# Patient Record
Sex: Female | Born: 1935 | Race: White | Hispanic: No | Marital: Married | Smoking: Never smoker
Health system: Southern US, Community
[De-identification: ages and names within clinical notes are randomized; demographics above are authoritative.]

## PROBLEM LIST (undated history)

## (undated) DIAGNOSIS — I2510 Atherosclerotic heart disease of native coronary artery without angina pectoris: Secondary | ICD-10-CM

## (undated) DIAGNOSIS — I639 Cerebral infarction, unspecified: Secondary | ICD-10-CM

## (undated) DIAGNOSIS — F0390 Unspecified dementia without behavioral disturbance: Secondary | ICD-10-CM

## (undated) DIAGNOSIS — I272 Pulmonary hypertension, unspecified: Secondary | ICD-10-CM

## (undated) DIAGNOSIS — M1990 Unspecified osteoarthritis, unspecified site: Secondary | ICD-10-CM

## (undated) DIAGNOSIS — J449 Chronic obstructive pulmonary disease, unspecified: Secondary | ICD-10-CM

## (undated) DIAGNOSIS — C50919 Malignant neoplasm of unspecified site of unspecified female breast: Secondary | ICD-10-CM

## (undated) DIAGNOSIS — I4891 Unspecified atrial fibrillation: Secondary | ICD-10-CM

## (undated) DIAGNOSIS — S065XAA Traumatic subdural hemorrhage with loss of consciousness status unknown, initial encounter: Secondary | ICD-10-CM

## (undated) DIAGNOSIS — S065X9A Traumatic subdural hemorrhage with loss of consciousness of unspecified duration, initial encounter: Secondary | ICD-10-CM

## (undated) HISTORY — PX: BRAIN SURGERY: SHX531

## (undated) HISTORY — PX: TOE AMPUTATION: SHX809

## (undated) HISTORY — PX: CARDIAC SURGERY: SHX584

---

## 1999-02-15 ENCOUNTER — Encounter: Payer: Self-pay | Admitting: Neurosurgery

## 1999-02-15 ENCOUNTER — Inpatient Hospital Stay (HOSPITAL_COMMUNITY): Admission: EM | Admit: 1999-02-15 | Discharge: 1999-02-17 | Payer: Self-pay | Admitting: Neurosurgery

## 1999-02-17 ENCOUNTER — Encounter: Payer: Self-pay | Admitting: Neurosurgery

## 1999-02-23 ENCOUNTER — Inpatient Hospital Stay (HOSPITAL_COMMUNITY): Admission: AD | Admit: 1999-02-23 | Discharge: 1999-03-13 | Payer: Self-pay | Admitting: Neurosurgery

## 1999-02-24 ENCOUNTER — Encounter: Payer: Self-pay | Admitting: Neurosurgery

## 1999-02-25 ENCOUNTER — Encounter: Payer: Self-pay | Admitting: Neurosurgery

## 1999-03-02 ENCOUNTER — Encounter: Payer: Self-pay | Admitting: Pulmonary Disease

## 1999-03-04 ENCOUNTER — Encounter: Payer: Self-pay | Admitting: Neurological Surgery

## 1999-03-06 ENCOUNTER — Encounter: Payer: Self-pay | Admitting: Neurosurgery

## 1999-03-12 ENCOUNTER — Encounter: Payer: Self-pay | Admitting: Neurosurgery

## 1999-03-13 ENCOUNTER — Inpatient Hospital Stay (HOSPITAL_COMMUNITY)
Admission: RE | Admit: 1999-03-13 | Discharge: 1999-04-04 | Payer: Self-pay | Admitting: Physical Medicine and Rehabilitation

## 1999-03-14 ENCOUNTER — Encounter: Payer: Self-pay | Admitting: Physical Medicine and Rehabilitation

## 1999-03-18 ENCOUNTER — Encounter: Payer: Self-pay | Admitting: Physical Medicine & Rehabilitation

## 1999-03-27 ENCOUNTER — Encounter: Payer: Self-pay | Admitting: Physical Medicine and Rehabilitation

## 1999-04-05 ENCOUNTER — Encounter
Admission: RE | Admit: 1999-04-05 | Discharge: 1999-04-27 | Payer: Self-pay | Admitting: Physical Medicine and Rehabilitation

## 2003-11-10 ENCOUNTER — Other Ambulatory Visit: Payer: Self-pay

## 2004-01-14 ENCOUNTER — Other Ambulatory Visit: Payer: Self-pay

## 2004-05-27 ENCOUNTER — Emergency Department: Payer: Self-pay | Admitting: General Practice

## 2004-05-28 ENCOUNTER — Emergency Department: Payer: Self-pay | Admitting: Emergency Medicine

## 2004-06-01 ENCOUNTER — Ambulatory Visit: Payer: Self-pay | Admitting: Internal Medicine

## 2004-07-12 ENCOUNTER — Encounter: Admission: RE | Admit: 2004-07-12 | Discharge: 2004-07-12 | Payer: Self-pay | Admitting: Neurosurgery

## 2004-08-21 ENCOUNTER — Inpatient Hospital Stay (HOSPITAL_COMMUNITY): Admission: RE | Admit: 2004-08-21 | Discharge: 2004-08-22 | Payer: Self-pay | Admitting: Neurosurgery

## 2004-09-12 ENCOUNTER — Encounter: Admission: RE | Admit: 2004-09-12 | Discharge: 2004-09-12 | Payer: Self-pay | Admitting: Neurosurgery

## 2004-11-16 ENCOUNTER — Encounter: Payer: Self-pay | Admitting: Rheumatology

## 2004-11-24 ENCOUNTER — Encounter: Payer: Self-pay | Admitting: Rheumatology

## 2005-01-20 ENCOUNTER — Inpatient Hospital Stay: Payer: Self-pay | Admitting: Internal Medicine

## 2005-01-20 ENCOUNTER — Other Ambulatory Visit: Payer: Self-pay

## 2005-05-15 ENCOUNTER — Ambulatory Visit: Payer: Self-pay | Admitting: Internal Medicine

## 2006-06-04 ENCOUNTER — Ambulatory Visit: Payer: Self-pay | Admitting: Internal Medicine

## 2006-06-05 ENCOUNTER — Ambulatory Visit: Payer: Self-pay | Admitting: Anesthesiology

## 2006-06-11 ENCOUNTER — Ambulatory Visit: Payer: Self-pay | Admitting: Anesthesiology

## 2006-06-26 ENCOUNTER — Ambulatory Visit: Payer: Self-pay | Admitting: Anesthesiology

## 2006-07-07 ENCOUNTER — Ambulatory Visit: Payer: Self-pay | Admitting: Anesthesiology

## 2006-08-06 ENCOUNTER — Ambulatory Visit: Payer: Self-pay | Admitting: Anesthesiology

## 2006-09-09 ENCOUNTER — Ambulatory Visit: Payer: Self-pay | Admitting: Anesthesiology

## 2006-09-23 ENCOUNTER — Ambulatory Visit: Payer: Self-pay | Admitting: Cardiology

## 2006-10-09 ENCOUNTER — Ambulatory Visit: Payer: Self-pay | Admitting: Anesthesiology

## 2006-11-10 ENCOUNTER — Ambulatory Visit: Payer: Self-pay | Admitting: Anesthesiology

## 2006-12-18 ENCOUNTER — Ambulatory Visit: Payer: Self-pay | Admitting: Anesthesiology

## 2007-02-09 ENCOUNTER — Other Ambulatory Visit: Payer: Self-pay

## 2007-02-09 ENCOUNTER — Inpatient Hospital Stay: Payer: Self-pay | Admitting: Internal Medicine

## 2007-02-10 ENCOUNTER — Other Ambulatory Visit: Payer: Self-pay

## 2007-04-10 ENCOUNTER — Ambulatory Visit: Payer: Self-pay | Admitting: Anesthesiology

## 2007-04-30 ENCOUNTER — Ambulatory Visit: Payer: Self-pay | Admitting: Anesthesiology

## 2007-05-20 ENCOUNTER — Ambulatory Visit: Payer: Self-pay | Admitting: Unknown Physician Specialty

## 2007-05-22 ENCOUNTER — Ambulatory Visit: Payer: Self-pay | Admitting: Unknown Physician Specialty

## 2007-06-11 ENCOUNTER — Ambulatory Visit: Payer: Self-pay | Admitting: Anesthesiology

## 2007-07-20 ENCOUNTER — Ambulatory Visit: Payer: Self-pay | Admitting: Anesthesiology

## 2007-08-11 ENCOUNTER — Ambulatory Visit: Payer: Self-pay | Admitting: Internal Medicine

## 2007-09-10 ENCOUNTER — Ambulatory Visit: Payer: Self-pay | Admitting: Internal Medicine

## 2007-10-15 ENCOUNTER — Ambulatory Visit: Payer: Self-pay | Admitting: Anesthesiology

## 2008-01-14 ENCOUNTER — Ambulatory Visit: Payer: Self-pay | Admitting: Anesthesiology

## 2008-04-13 ENCOUNTER — Ambulatory Visit: Payer: Self-pay | Admitting: Anesthesiology

## 2008-06-08 ENCOUNTER — Ambulatory Visit: Payer: Self-pay | Admitting: Unknown Physician Specialty

## 2008-07-28 ENCOUNTER — Ambulatory Visit: Payer: Self-pay | Admitting: Unknown Physician Specialty

## 2008-08-01 ENCOUNTER — Ambulatory Visit: Payer: Self-pay | Admitting: Unknown Physician Specialty

## 2008-09-04 ENCOUNTER — Observation Stay: Payer: Self-pay | Admitting: Internal Medicine

## 2008-09-14 ENCOUNTER — Inpatient Hospital Stay: Payer: Self-pay | Admitting: Internal Medicine

## 2008-10-19 ENCOUNTER — Ambulatory Visit: Payer: Self-pay | Admitting: Internal Medicine

## 2009-01-18 ENCOUNTER — Encounter: Admission: RE | Admit: 2009-01-18 | Discharge: 2009-01-18 | Payer: Self-pay | Admitting: Neurosurgery

## 2009-07-16 ENCOUNTER — Emergency Department: Payer: Self-pay | Admitting: Emergency Medicine

## 2009-10-23 ENCOUNTER — Ambulatory Visit: Payer: Self-pay | Admitting: Internal Medicine

## 2010-04-26 IMAGING — CT CT ABD-PELV W/O CM
1 of 2 series · 15 of 32 positions shown, 19 images · non-contrast
Comparison: None

REASON FOR EXAM: (1) LLQ pain, vomiting concern for diverticulitis/stone
allergic to iodine; (2)
COMMENTS:

PROCEDURE:     CT  - CT ABDOMEN AND PELVIS W[DATE] [DATE]
RESULT:     Indication: Left lower quadrant pain
TECHNIQUE: Multiple axial images from the lung bases to the symphysis pubis
were obtained without oral and without intravenous contrast.

[Series 2: stone · axial · 0.67mm/px · z∈[-896,-518]mm · 15 of 138 slices shown, 19 images]
[im 6/138  soft-tissue]
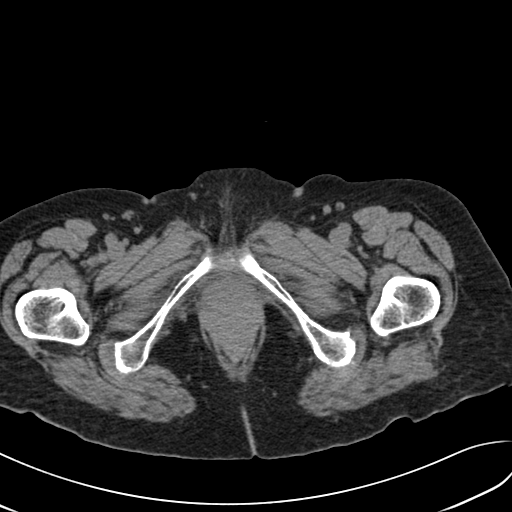
[im 6/138  bone]
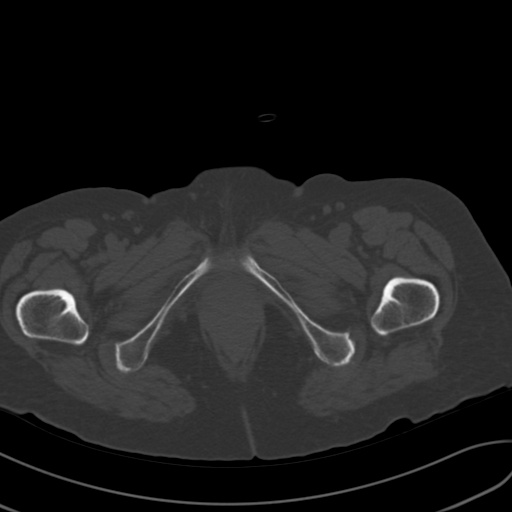
[im 16/138  soft-tissue]
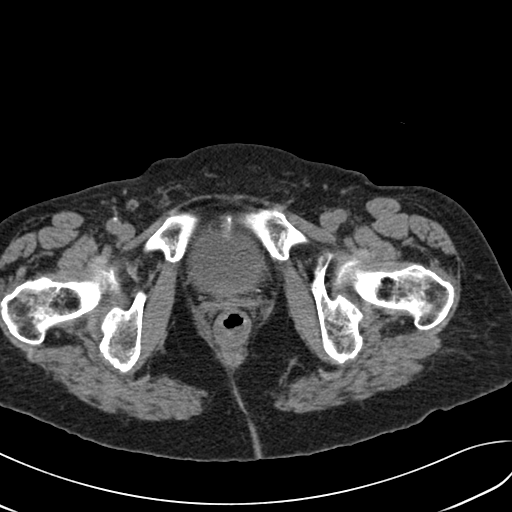
[im 27/138  soft-tissue]
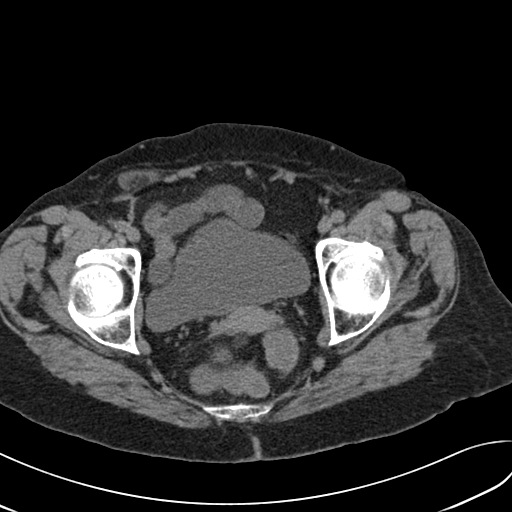
[im 37/138  soft-tissue]
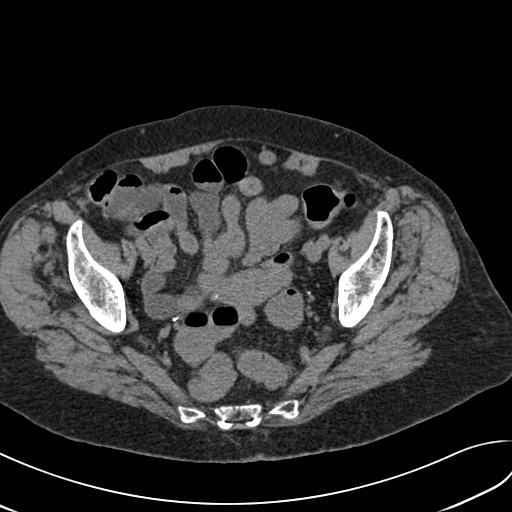
[im 48/138  soft-tissue]
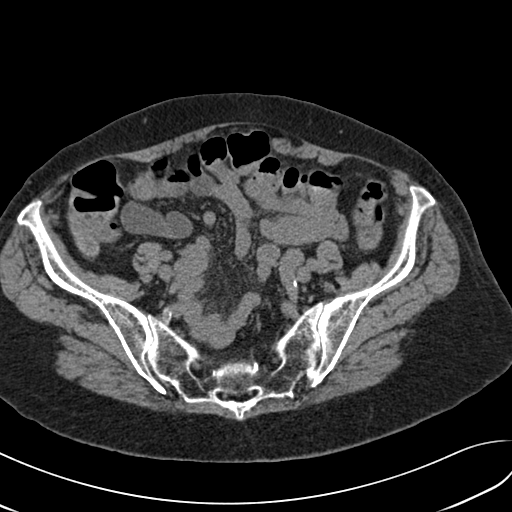
[im 58/138  soft-tissue]
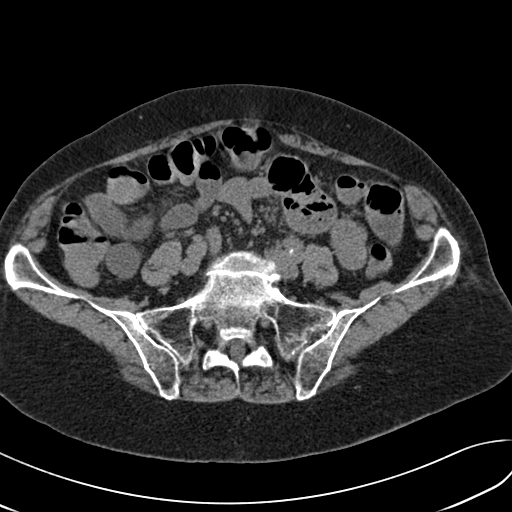
[im 69/138  soft-tissue]
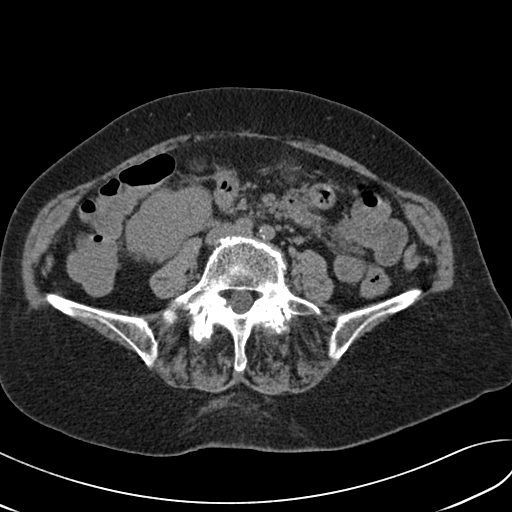
[im 80/138  soft-tissue]
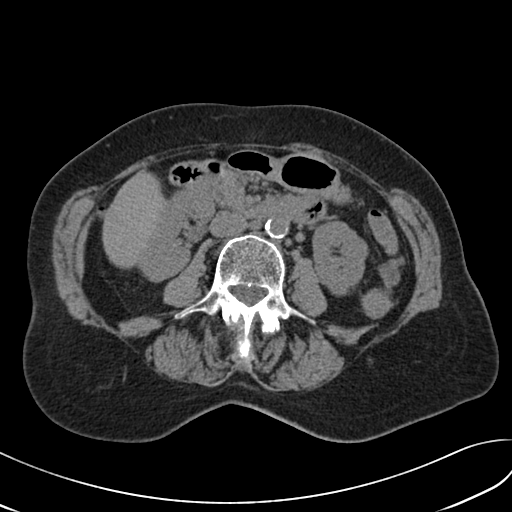
[im 90/138  soft-tissue]
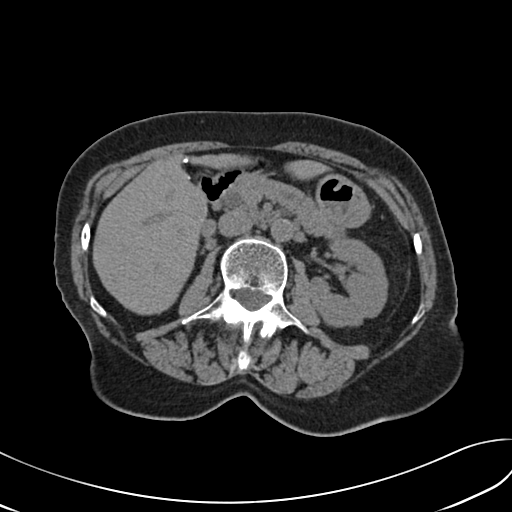
[im 90/138  bone]
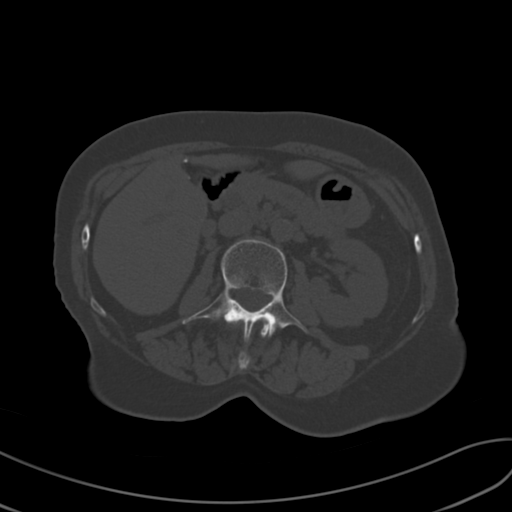
[im 101/138  soft-tissue]
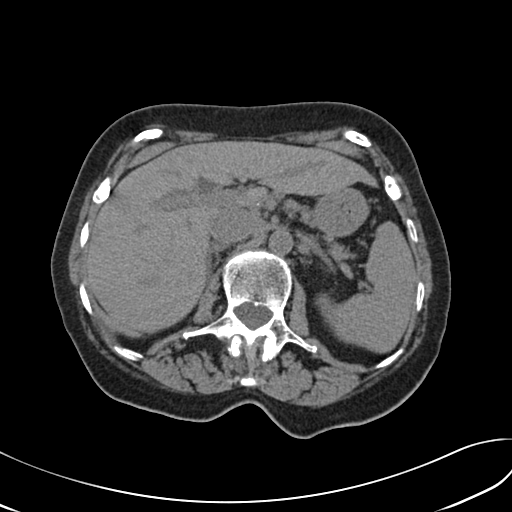
[im 111/138  soft-tissue]
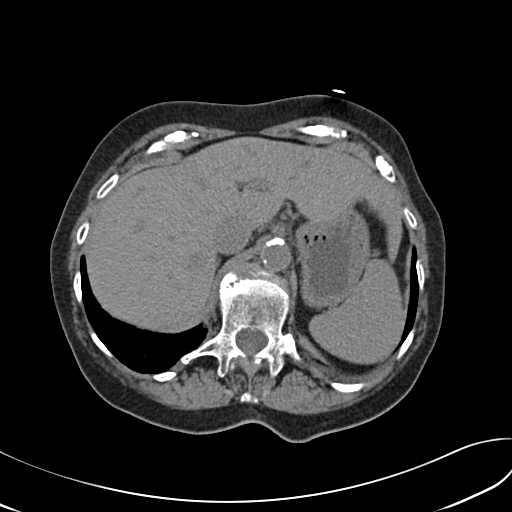
[im 116/138  lung]
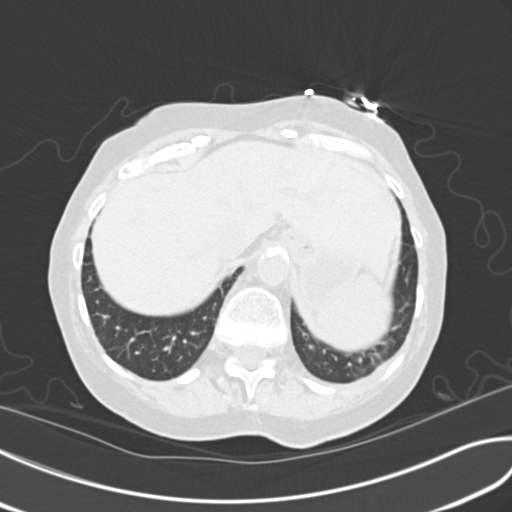
[im 122/138  soft-tissue]
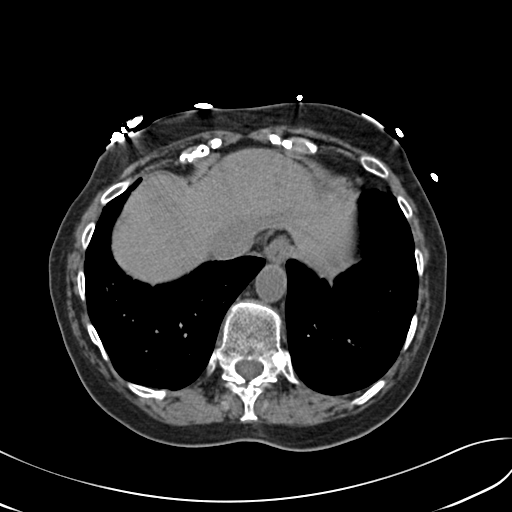
[im 122/138  lung]
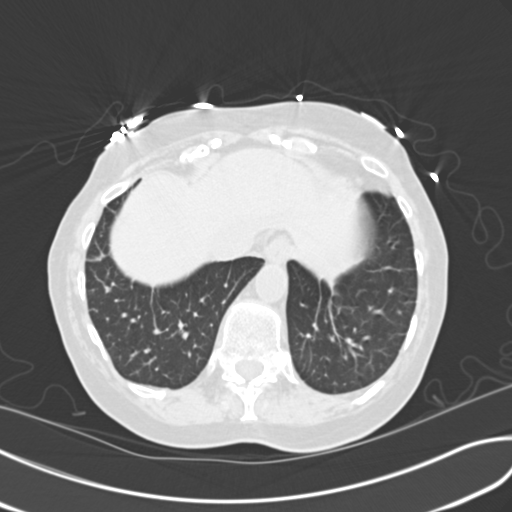
[im 127/138  lung]
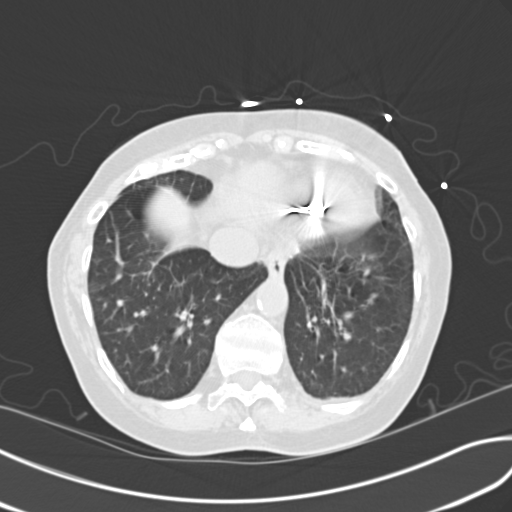
[im 132/138  soft-tissue]
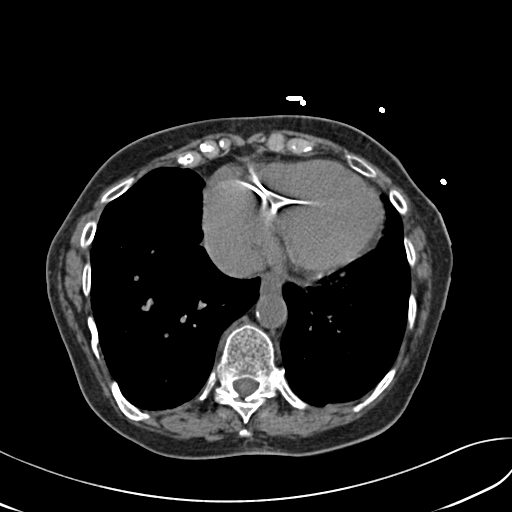
[im 132/138  lung]
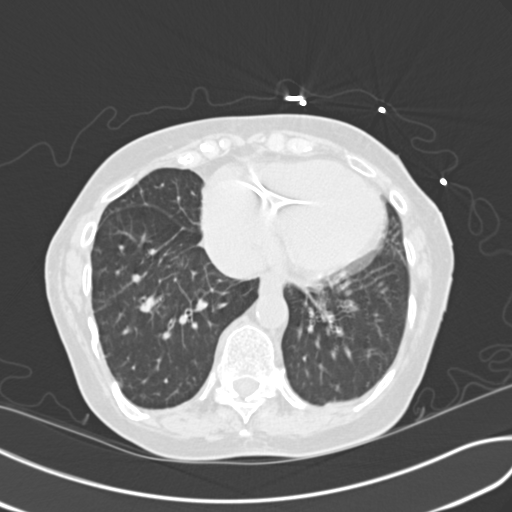

[15 of 32 positions shown; findings below may reference images not displayed]

FINDINGS: The lung bases are clear. There is no pleural or pericardial effusions.

No renal, ureteral, or bladder calculi. No obstructive uropathy. No
perinephric stranding is seen. The kidneys are symmetric in size without
evidence for exophytic mass. The bladder is unremarkable.

The liver demonstrates no focal abnormality. The gallbladder is surgically
absent. The common bile duct is dilated. There is a coarse calcification
within the spleen. The spleen demonstrates no other focal abnormality. The
adrenal glands and pancreas are normal.

There is a fat containing umbilical hernia. The unopacified stomach,
duodenum, small intestine, and large intestine are unremarkable, but
evaluation is limited by lack of oral contrast. There is a normal caliber
appendix in the right lower quadrant without periappendiceal inflammatory
changes. There is no pneumoperitoneum, pneumatosis, or portal venous gas.
There is no abdominal or pelvic free fluid. There is no lymphadenopathy.

The abdominal aorta is normal in caliber with atherosclerosis.

There is severe L5-S1 facet arthropathy. There is lumbar spine spondylosis.
IMPRESSION: 1. No acute abdominal or pelvic pathology.
2. Normal appendix.
3. The common bile duct is dilated which may be secondary to the
postcholecystectomy state versus a distal obstruction. Correlate with liver
function tests.

## 2010-07-04 ENCOUNTER — Ambulatory Visit: Payer: Self-pay | Admitting: Internal Medicine

## 2010-07-13 ENCOUNTER — Ambulatory Visit: Payer: Self-pay | Admitting: Internal Medicine

## 2010-07-16 ENCOUNTER — Encounter: Payer: Self-pay | Admitting: Internal Medicine

## 2010-07-26 ENCOUNTER — Encounter: Payer: Self-pay | Admitting: Internal Medicine

## 2010-10-12 NOTE — Op Note (Signed)
NAMEJENNIFERANN, Brittany Casey                  ACCOUNT NO.:  [id number]   MEDICAL RECORD NO.:  [id number]          PATIENT TYPE:  INP   LOCATION:  2899                         FACILITY:  [facility]   PHYSICIAN:  Reinaldo Meeker, M.D. DATE OF BIRTH:  1927/04/26   DATE OF PROCEDURE:  08/21/2004  DATE OF DISCHARGE:                                 OPERATIVE REPORT   PREOPERATIVE DIAGNOSIS:  Spondylosis of C4-C5 and C5-C6 left.   POSTOPERATIVE DIAGNOSIS:  Spondylosis of C4-C5 and C5-C6 left.   PROCEDURE:  C4-C5 and C5-C6 anterior cervical discectomy with bone bank  fusion followed by Atlantis anterior cervical plating.   SURGEON:  Reinaldo Meeker, M.D.   ASSISTANT:  Brittany Casey. Pool, M.D.   PROCEDURE IN DETAIL:  After being placed in the supine position in 5 pounds  of halter traction, the patient's neck was prepped and draped in the usual  sterile fashion.  Localizing x-ray was taken prior to the incision to  identify the appropriate level.  A transverse incision was made in the right  anterior neck starting at the midline and heading towards the medial aspect  of the sternocleidomastoid muscle.  The platysmal muscle was incised  transversely.  Then, the fascial plane between the strap muscles medially  and the sternocleidomastoid muscle laterally was identified down to the  anterior aspect of the cervical spine.  The longus colli muscles were  identified, split in the midline, swept away bilaterally with a Automotive engineer.  A second x-ray showed we approached the  appropriate level.  Using the 15 blade, the annulus of the disc at C4-C5 and  C5-C6 was incised.  Using pituitaries, rongeurs, and curets, thorough disc  space clean out was carried out at both levels.  A high speed drill was used  to widen the interspace.  The microscope was draped and brought into the  field and used until the end of the case.  Starting at C4-C5, the very  limited disc space was followed down to the  anterior aspect of the posterior  longitudinal ligament.  The ligament and bony overgrowth were removed to  expose the underlying spinal dura.  Proximal foraminal decompression was  carried out bilaterally and more aggressive foraminal decompression was  carried out on the patient's left side until the C5 nerve root was well  decompressed.  At this point, inspection was carried out at this level for  any evidence of residual compression and none could be identified.  A  similar decompression was carried out at C5-C6, once again using the high  speed drill to widen the interspace, remove the remainder of the disc  material and bony overgrowth, as well as the posterior longitudinal  ligament. Once again, generous foraminal decompression was carried out  towards the left common symptomatic side and less aggressive decompression  towards the right asymptomatic side.  At this point, inspection was carried  out in all directions for any evidence of residual compression.  Large  amounts of irrigation were carried out.  Measurements were taken and a 9 mm  and 7 mm bone plug was reconstituted.  After irrigating once more and  confirming hemostasis, the 7 mm plug was impacted in C5-C6 and the 9 mm plug  in C4-C5.  X-ray showed the plugs to be in good position.  An appropriate  length Vision anterior cervical plate was then chosen.  Under fluoroscopic  guidance, drill holes were placed, followed by tapping and placement of 13  mm screws x 6.  Final fluoroscopy showed the plate, plug, and screws to all  be in good position.  The locking mechanism was then secured.  Large amounts  of irrigation were carried out at  this time and any bleeding controlled with bipolar coagulation.  The wound  was then closed using interrupted Vicryl in the platysma muscle, inverted 5-  0 PDS on the subcuticular layer, and Steri-Strips on the skin.  Sterile  dressings were then applied and the patient was extubated and  taken to the  recovery room in stable condition.      ROK/MEDQ  D:  08/21/2004  T:  08/21/2004  Job:  [id number]

## 2010-10-25 ENCOUNTER — Ambulatory Visit: Payer: Self-pay | Admitting: Internal Medicine

## 2010-12-21 ENCOUNTER — Inpatient Hospital Stay: Payer: Self-pay | Admitting: Internal Medicine

## 2011-08-20 ENCOUNTER — Ambulatory Visit: Payer: Self-pay | Admitting: Cardiology

## 2011-10-01 ENCOUNTER — Emergency Department: Payer: Self-pay | Admitting: *Deleted

## 2011-10-01 LAB — COMPREHENSIVE METABOLIC PANEL
Anion Gap: 11 (ref 7–16)
BUN: 43 mg/dL — ABNORMAL HIGH (ref 7–18)
Bilirubin,Total: 0.5 mg/dL (ref 0.2–1.0)
Chloride: 101 mmol/L (ref 98–107)
EGFR (African American): 35 — ABNORMAL LOW
Osmolality: 296 (ref 275–301)
Potassium: 3.2 mmol/L — ABNORMAL LOW (ref 3.5–5.1)

## 2011-10-01 LAB — CBC
HCT: 31.8 % — ABNORMAL LOW (ref 35.0–47.0)
HGB: 10.8 g/dL — ABNORMAL LOW (ref 12.0–16.0)
MCHC: 33.8 g/dL (ref 32.0–36.0)
MCV: 93 fL (ref 80–100)
RBC: 3.41 10*6/uL — ABNORMAL LOW (ref 3.80–5.20)
RDW: 14.8 % — ABNORMAL HIGH (ref 11.5–14.5)
WBC: 8.8 10*3/uL (ref 3.6–11.0)

## 2011-10-01 LAB — URINALYSIS, COMPLETE
Bilirubin,UR: NEGATIVE
Glucose,UR: NEGATIVE mg/dL (ref 0–75)
Ketone: NEGATIVE
Ph: 5 (ref 4.5–8.0)
RBC,UR: 1 /HPF (ref 0–5)
Specific Gravity: 1.015 (ref 1.003–1.030)

## 2011-10-01 LAB — LIPASE, BLOOD: Lipase: 307 U/L (ref 73–393)

## 2011-10-02 ENCOUNTER — Inpatient Hospital Stay: Payer: Self-pay | Admitting: Family Medicine

## 2011-10-02 LAB — CBC
HCT: 28.4 % — ABNORMAL LOW (ref 35.0–47.0)
HGB: 9.6 g/dL — ABNORMAL LOW (ref 12.0–16.0)
MCH: 31.6 pg (ref 26.0–34.0)
MCHC: 33.9 g/dL (ref 32.0–36.0)
MCV: 93 fL (ref 80–100)
Platelet: 144 10*3/uL — ABNORMAL LOW (ref 150–440)
RBC: 3.04 10*6/uL — ABNORMAL LOW (ref 3.80–5.20)
RDW: 14.7 % — ABNORMAL HIGH (ref 11.5–14.5)
WBC: 9 10*3/uL (ref 3.6–11.0)

## 2011-10-02 LAB — COMPREHENSIVE METABOLIC PANEL
Albumin: 3.3 g/dL — ABNORMAL LOW (ref 3.4–5.0)
Alkaline Phosphatase: 91 U/L (ref 50–136)
BUN: 40 mg/dL — ABNORMAL HIGH (ref 7–18)
Bilirubin,Total: 0.7 mg/dL (ref 0.2–1.0)
Calcium, Total: 8.7 mg/dL (ref 8.5–10.1)
Chloride: 101 mmol/L (ref 98–107)
Co2: 30 mmol/L (ref 21–32)
EGFR (African American): 34 — ABNORMAL LOW
EGFR (Non-African Amer.): 29 — ABNORMAL LOW
SGOT(AST): 92 U/L — ABNORMAL HIGH (ref 15–37)
Sodium: 138 mmol/L (ref 136–145)
Total Protein: 6.8 g/dL (ref 6.4–8.2)

## 2011-10-02 LAB — URINALYSIS, COMPLETE
Nitrite: NEGATIVE
Ph: 5 (ref 4.5–8.0)
Protein: NEGATIVE
RBC,UR: <1 /HPF (ref 0–5)
Specific Gravity: 1.013 (ref 1.003–1.030)
Squamous Epithelial: 1
WBC UR: <1 /HPF (ref 0–5)

## 2011-10-02 LAB — PRO B NATRIURETIC PEPTIDE: B-Type Natriuretic Peptide: 6478 pg/mL — ABNORMAL HIGH (ref 0–450)

## 2011-10-02 LAB — LIPASE, BLOOD: Lipase: 142 U/L (ref 73–393)

## 2011-10-02 LAB — IRON AND TIBC
Iron Bind.Cap.(Total): 352 ug/dL (ref 250–450)
Iron Saturation: 21 %
Unbound Iron-Bind.Cap.: 279 ug/dL

## 2011-10-02 LAB — TROPONIN I: Troponin-I: 0.03 ng/mL

## 2011-10-02 LAB — CK TOTAL AND CKMB (NOT AT ARMC): CK-MB: 2.1 ng/mL (ref 0.5–3.6)

## 2011-10-03 LAB — BASIC METABOLIC PANEL
Anion Gap: 9 (ref 7–16)
Calcium, Total: 8.9 mg/dL (ref 8.5–10.1)
EGFR (African American): 39 — ABNORMAL LOW
EGFR (Non-African Amer.): 34 — ABNORMAL LOW
Osmolality: 289 (ref 275–301)
Potassium: 2.8 mmol/L — ABNORMAL LOW (ref 3.5–5.1)
Sodium: 140 mmol/L (ref 136–145)

## 2011-10-03 LAB — CBC WITH DIFFERENTIAL/PLATELET
Basophil #: 0 10*3/uL (ref 0.0–0.1)
Eosinophil #: 0 10*3/uL (ref 0.0–0.7)
HGB: 10.7 g/dL — ABNORMAL LOW (ref 12.0–16.0)
Lymphocyte #: 0.9 10*3/uL — ABNORMAL LOW (ref 1.0–3.6)
Lymphocyte %: 12 %
MCHC: 34 g/dL (ref 32.0–36.0)
MCV: 93 fL (ref 80–100)
Monocyte #: 0.8 x10 3/mm (ref 0.2–0.9)
Neutrophil #: 6.1 10*3/uL (ref 1.4–6.5)
Neutrophil %: 77 %
RDW: 14.7 % — ABNORMAL HIGH (ref 11.5–14.5)

## 2011-10-03 LAB — LIPID PANEL
Cholesterol: 146 mg/dL (ref 0–200)
HDL Cholesterol: 51 mg/dL (ref 40–60)
Ldl Cholesterol, Calc: 77 mg/dL (ref 0–100)
Triglycerides: 88 mg/dL (ref 0–200)

## 2011-10-03 LAB — CK TOTAL AND CKMB (NOT AT ARMC): CK-MB: 2.9 ng/mL (ref 0.5–3.6)

## 2011-10-03 LAB — TROPONIN I: Troponin-I: 0.04 ng/mL

## 2011-10-03 LAB — HEMOGLOBIN A1C: Hemoglobin A1C: 5.5 % (ref 4.2–6.3)

## 2011-10-04 LAB — BASIC METABOLIC PANEL
Anion Gap: 8 (ref 7–16)
BUN: 39 mg/dL — ABNORMAL HIGH (ref 7–18)
Calcium, Total: 8.6 mg/dL (ref 8.5–10.1)
Chloride: 99 mmol/L (ref 98–107)
Co2: 35 mmol/L — ABNORMAL HIGH (ref 21–32)
Creatinine: 1.37 mg/dL — ABNORMAL HIGH (ref 0.60–1.30)
EGFR (African American): 41 — ABNORMAL LOW
EGFR (Non-African Amer.): 36 — ABNORMAL LOW
Osmolality: 293 (ref 275–301)
Potassium: 2.7 mmol/L — ABNORMAL LOW (ref 3.5–5.1)

## 2011-10-04 LAB — MAGNESIUM: Magnesium: 1.6 mg/dL — ABNORMAL LOW

## 2011-10-05 LAB — BASIC METABOLIC PANEL
BUN: 35 mg/dL — ABNORMAL HIGH (ref 7–18)
Calcium, Total: 8.6 mg/dL (ref 8.5–10.1)
Chloride: 100 mmol/L (ref 98–107)
Co2: 32 mmol/L (ref 21–32)
Creatinine: 1.21 mg/dL (ref 0.60–1.30)
EGFR (African American): 48 — ABNORMAL LOW
Osmolality: 290 (ref 275–301)
Sodium: 141 mmol/L (ref 136–145)

## 2011-10-05 LAB — URINALYSIS, COMPLETE
Bacteria: NONE SEEN
Bilirubin,UR: NEGATIVE
Glucose,UR: NEGATIVE mg/dL (ref 0–75)
Ketone: NEGATIVE
Nitrite: NEGATIVE
Ph: 7 (ref 4.5–8.0)
RBC,UR: 1 /HPF (ref 0–5)
WBC UR: 2 /HPF (ref 0–5)

## 2011-10-05 LAB — MAGNESIUM: Magnesium: 1.9 mg/dL

## 2011-10-05 LAB — CBC WITH DIFFERENTIAL/PLATELET
Eosinophil #: 0 10*3/uL (ref 0.0–0.7)
HCT: 31.7 % — ABNORMAL LOW (ref 35.0–47.0)
HGB: 10.8 g/dL — ABNORMAL LOW (ref 12.0–16.0)
Lymphocyte #: 0.8 10*3/uL — ABNORMAL LOW (ref 1.0–3.6)
Lymphocyte %: 17.1 %
MCH: 31.6 pg (ref 26.0–34.0)
MCHC: 34.1 g/dL (ref 32.0–36.0)
MCV: 93 fL (ref 80–100)
Monocyte #: 0.6 x10 3/mm (ref 0.2–0.9)
Monocyte %: 12.4 %
Neutrophil #: 3.5 10*3/uL (ref 1.4–6.5)
RBC: 3.41 10*6/uL — ABNORMAL LOW (ref 3.80–5.20)
RDW: 14.5 % (ref 11.5–14.5)

## 2011-10-06 LAB — BASIC METABOLIC PANEL
Anion Gap: 8 (ref 7–16)
BUN: 34 mg/dL — ABNORMAL HIGH (ref 7–18)
Calcium, Total: 8.5 mg/dL (ref 8.5–10.1)
Chloride: 99 mmol/L (ref 98–107)
Co2: 33 mmol/L — ABNORMAL HIGH (ref 21–32)
Creatinine: 1.36 mg/dL — ABNORMAL HIGH (ref 0.60–1.30)
EGFR (African American): 42 — ABNORMAL LOW
EGFR (Non-African Amer.): 36 — ABNORMAL LOW
Glucose: 100 mg/dL — ABNORMAL HIGH (ref 65–99)
Osmolality: 287 (ref 275–301)
Sodium: 140 mmol/L (ref 136–145)

## 2011-10-06 LAB — CBC WITH DIFFERENTIAL/PLATELET
Basophil %: 0.5 %
Eosinophil #: 0 10*3/uL (ref 0.0–0.7)
HCT: 30.5 % — ABNORMAL LOW (ref 35.0–47.0)
HGB: 10.6 g/dL — ABNORMAL LOW (ref 12.0–16.0)
Lymphocyte #: 1 10*3/uL (ref 1.0–3.6)
Lymphocyte %: 18.2 %
MCH: 32.1 pg (ref 26.0–34.0)
MCHC: 34.6 g/dL (ref 32.0–36.0)
Neutrophil #: 3.7 10*3/uL (ref 1.4–6.5)
Neutrophil %: 67.2 %
Platelet: 172 10*3/uL (ref 150–440)
WBC: 5.5 10*3/uL (ref 3.6–11.0)

## 2011-10-06 LAB — OCCULT BLOOD X 1 CARD TO LAB, STOOL: Occult Blood, Feces: NEGATIVE

## 2011-10-29 ENCOUNTER — Ambulatory Visit: Payer: Self-pay | Admitting: Internal Medicine

## 2011-12-25 ENCOUNTER — Inpatient Hospital Stay: Payer: Self-pay | Admitting: Internal Medicine

## 2011-12-25 LAB — URINALYSIS, COMPLETE
Blood: NEGATIVE
Glucose,UR: NEGATIVE mg/dL (ref 0–75)
Hyaline Cast: 1
Ketone: NEGATIVE
Nitrite: NEGATIVE
Protein: NEGATIVE
Specific Gravity: 1.01 (ref 1.003–1.030)
WBC UR: 1 /HPF (ref 0–5)

## 2011-12-25 LAB — COMPREHENSIVE METABOLIC PANEL
Albumin: 3.4 g/dL (ref 3.4–5.0)
Alkaline Phosphatase: 91 U/L (ref 50–136)
Calcium, Total: 8.9 mg/dL (ref 8.5–10.1)
Co2: 31 mmol/L (ref 21–32)
Glucose: 89 mg/dL (ref 65–99)
SGOT(AST): 48 U/L — ABNORMAL HIGH (ref 15–37)
SGPT (ALT): 28 U/L
Sodium: 141 mmol/L (ref 136–145)

## 2011-12-25 LAB — CBC
HCT: 30.1 % — ABNORMAL LOW (ref 35.0–47.0)
HGB: 10.4 g/dL — ABNORMAL LOW (ref 12.0–16.0)
MCV: 92 fL (ref 80–100)
RBC: 3.29 10*6/uL — ABNORMAL LOW (ref 3.80–5.20)
RDW: 14.9 % — ABNORMAL HIGH (ref 11.5–14.5)
WBC: 6.3 10*3/uL (ref 3.6–11.0)

## 2011-12-25 LAB — CK TOTAL AND CKMB (NOT AT ARMC): CK-MB: 2 ng/mL (ref 0.5–3.6)

## 2011-12-26 LAB — CBC WITH DIFFERENTIAL/PLATELET
Eosinophil #: 0.1 10*3/uL (ref 0.0–0.7)
Lymphocyte %: 15.5 %
MCH: 31.4 pg (ref 26.0–34.0)
MCHC: 34.5 g/dL (ref 32.0–36.0)
Monocyte #: 1.1 x10 3/mm — ABNORMAL HIGH (ref 0.2–0.9)
Neutrophil %: 72.3 %
Platelet: 169 10*3/uL (ref 150–440)
RBC: 3.2 10*6/uL — ABNORMAL LOW (ref 3.80–5.20)
RDW: 15.1 % — ABNORMAL HIGH (ref 11.5–14.5)

## 2011-12-26 LAB — TROPONIN I
Troponin-I: 0.02 ng/mL
Troponin-I: 0.02 ng/mL

## 2011-12-26 LAB — BASIC METABOLIC PANEL
Calcium, Total: 8.6 mg/dL (ref 8.5–10.1)
Co2: 30 mmol/L (ref 21–32)
Glucose: 100 mg/dL — ABNORMAL HIGH (ref 65–99)
Potassium: 3.1 mmol/L — ABNORMAL LOW (ref 3.5–5.1)
Sodium: 142 mmol/L (ref 136–145)

## 2011-12-26 LAB — CK TOTAL AND CKMB (NOT AT ARMC)
CK, Total: 151 U/L (ref 21–215)
CK, Total: 144 U/L (ref 21–215)
CK-MB: 1.4 ng/mL (ref 0.5–3.6)

## 2011-12-27 LAB — BASIC METABOLIC PANEL
Anion Gap: 11 (ref 7–16)
BUN: 30 mg/dL — ABNORMAL HIGH (ref 7–18)
Creatinine: 1.32 mg/dL — ABNORMAL HIGH (ref 0.60–1.30)
EGFR (African American): 43 — ABNORMAL LOW
EGFR (Non-African Amer.): 37 — ABNORMAL LOW
Glucose: 95 mg/dL (ref 65–99)
Sodium: 142 mmol/L (ref 136–145)

## 2011-12-27 LAB — CBC WITH DIFFERENTIAL/PLATELET
Basophil #: 0 10*3/uL (ref 0.0–0.1)
Basophil %: 0.4 %
Eosinophil #: 0 10*3/uL (ref 0.0–0.7)
HCT: 29 % — ABNORMAL LOW (ref 35.0–47.0)
HGB: 10.2 g/dL — ABNORMAL LOW (ref 12.0–16.0)
Lymphocyte #: 0.9 10*3/uL — ABNORMAL LOW (ref 1.0–3.6)
Lymphocyte %: 16.9 %
MCH: 31.9 pg (ref 26.0–34.0)
MCHC: 35.3 g/dL (ref 32.0–36.0)
MCV: 90 fL (ref 80–100)
Neutrophil #: 3.7 10*3/uL (ref 1.4–6.5)
Platelet: 186 10*3/uL (ref 150–440)
RDW: 14.5 % (ref 11.5–14.5)

## 2011-12-28 LAB — BASIC METABOLIC PANEL
Anion Gap: 10 (ref 7–16)
Co2: 32 mmol/L (ref 21–32)
EGFR (African American): 35 — ABNORMAL LOW
EGFR (Non-African Amer.): 30 — ABNORMAL LOW

## 2012-10-16 ENCOUNTER — Emergency Department: Payer: Self-pay | Admitting: Emergency Medicine

## 2012-10-16 LAB — URINALYSIS, COMPLETE
Bacteria: NONE SEEN
Ketone: NEGATIVE
Nitrite: NEGATIVE
Ph: 5 (ref 4.5–8.0)
Protein: NEGATIVE
RBC,UR: 1 /HPF (ref 0–5)
Specific Gravity: 1.013 (ref 1.003–1.030)
Squamous Epithelial: 1
WBC UR: 1 /HPF (ref 0–5)

## 2012-10-16 LAB — COMPREHENSIVE METABOLIC PANEL
Alkaline Phosphatase: 75 U/L (ref 50–136)
Anion Gap: 8 (ref 7–16)
BUN: 58 mg/dL — ABNORMAL HIGH (ref 7–18)
Bilirubin,Total: 0.5 mg/dL (ref 0.2–1.0)
Chloride: 101 mmol/L (ref 98–107)
Co2: 32 mmol/L (ref 21–32)
EGFR (African American): 39 — ABNORMAL LOW
EGFR (Non-African Amer.): 34 — ABNORMAL LOW
Osmolality: 300 (ref 275–301)
SGPT (ALT): 33 U/L (ref 12–78)
Sodium: 141 mmol/L (ref 136–145)
Total Protein: 8 g/dL (ref 6.4–8.2)

## 2012-10-16 LAB — CBC: Platelet: 171 10*3/uL (ref 150–440)

## 2012-10-29 ENCOUNTER — Ambulatory Visit: Payer: Self-pay | Admitting: Internal Medicine

## 2013-01-23 ENCOUNTER — Inpatient Hospital Stay: Payer: Self-pay | Admitting: Internal Medicine

## 2013-01-23 LAB — PRO B NATRIURETIC PEPTIDE: B-Type Natriuretic Peptide: 3321 pg/mL — ABNORMAL HIGH (ref 0–450)

## 2013-01-23 LAB — CBC WITH DIFFERENTIAL/PLATELET
Basophil #: 0 10*3/uL (ref 0.0–0.1)
Eosinophil #: 0.1 10*3/uL (ref 0.0–0.7)
Eosinophil %: 1 %
HCT: 34.2 % — ABNORMAL LOW (ref 35.0–47.0)
Lymphocyte #: 0.8 10*3/uL — ABNORMAL LOW (ref 1.0–3.6)
Lymphocyte %: 11.7 %
MCHC: 34.4 g/dL (ref 32.0–36.0)
MCV: 91 fL (ref 80–100)
Monocyte #: 0.7 x10 3/mm (ref 0.2–0.9)
Monocyte %: 10.8 %
Neutrophil #: 5.1 10*3/uL (ref 1.4–6.5)
Neutrophil %: 76.1 %
Platelet: 169 10*3/uL (ref 150–440)
RBC: 3.75 10*6/uL — ABNORMAL LOW (ref 3.80–5.20)
RDW: 14.8 % — ABNORMAL HIGH (ref 11.5–14.5)
WBC: 6.7 10*3/uL (ref 3.6–11.0)

## 2013-01-23 LAB — COMPREHENSIVE METABOLIC PANEL
Anion Gap: 7 (ref 7–16)
Creatinine: 1.43 mg/dL — ABNORMAL HIGH (ref 0.60–1.30)
EGFR (African American): 39 — ABNORMAL LOW
EGFR (Non-African Amer.): 34 — ABNORMAL LOW
Osmolality: 286 (ref 275–301)
Potassium: 3.3 mmol/L — ABNORMAL LOW (ref 3.5–5.1)
Sodium: 137 mmol/L (ref 136–145)
Total Protein: 7.4 g/dL (ref 6.4–8.2)

## 2013-01-23 LAB — TROPONIN I: Troponin-I: 0.03 ng/mL

## 2013-01-24 LAB — BASIC METABOLIC PANEL
Anion Gap: 7 (ref 7–16)
Calcium, Total: 8.6 mg/dL (ref 8.5–10.1)
Chloride: 103 mmol/L (ref 98–107)
Co2: 32 mmol/L (ref 21–32)
Creatinine: 1.4 mg/dL — ABNORMAL HIGH (ref 0.60–1.30)
EGFR (African American): 40 — ABNORMAL LOW
EGFR (Non-African Amer.): 34 — ABNORMAL LOW
Glucose: 101 mg/dL — ABNORMAL HIGH (ref 65–99)
Osmolality: 294 (ref 275–301)
Potassium: 3.7 mmol/L (ref 3.5–5.1)
Sodium: 142 mmol/L (ref 136–145)

## 2013-01-24 LAB — HEMATOCRIT: HCT: 31.1 % — ABNORMAL LOW (ref 35.0–47.0)

## 2013-01-24 LAB — TROPONIN I: Troponin-I: 0.04 ng/mL

## 2013-07-26 ENCOUNTER — Emergency Department: Payer: Self-pay | Admitting: Emergency Medicine

## 2013-09-19 ENCOUNTER — Emergency Department: Payer: Self-pay | Admitting: Internal Medicine

## 2013-09-26 ENCOUNTER — Emergency Department: Payer: Self-pay | Admitting: Emergency Medicine

## 2013-10-07 ENCOUNTER — Other Ambulatory Visit: Payer: Self-pay | Admitting: Family

## 2013-10-07 LAB — CLOSTRIDIUM DIFFICILE(ARMC)

## 2013-10-22 ENCOUNTER — Emergency Department (HOSPITAL_COMMUNITY): Payer: Medicare Other

## 2013-10-22 ENCOUNTER — Other Ambulatory Visit: Payer: Self-pay

## 2013-10-22 ENCOUNTER — Emergency Department (HOSPITAL_COMMUNITY)
Admission: EM | Admit: 2013-10-22 | Discharge: 2013-10-22 | Disposition: A | Discharge status: Home / Self Care | Payer: Medicare Other | Attending: Emergency Medicine | Admitting: Emergency Medicine

## 2013-10-22 ENCOUNTER — Encounter (HOSPITAL_COMMUNITY): Payer: Self-pay | Admitting: Emergency Medicine

## 2013-10-22 DIAGNOSIS — R079 Chest pain, unspecified: Secondary | ICD-10-CM | POA: Diagnosis not present

## 2013-10-22 DIAGNOSIS — Z7982 Long term (current) use of aspirin: Secondary | ICD-10-CM | POA: Diagnosis not present

## 2013-10-22 DIAGNOSIS — Z950 Presence of cardiac pacemaker: Secondary | ICD-10-CM | POA: Diagnosis not present

## 2013-10-22 DIAGNOSIS — Z8673 Personal history of transient ischemic attack (TIA), and cerebral infarction without residual deficits: Secondary | ICD-10-CM | POA: Insufficient documentation

## 2013-10-22 DIAGNOSIS — Z79899 Other long term (current) drug therapy: Secondary | ICD-10-CM | POA: Diagnosis not present

## 2013-10-22 DIAGNOSIS — R0602 Shortness of breath: Secondary | ICD-10-CM | POA: Insufficient documentation

## 2013-10-22 DIAGNOSIS — F0390 Unspecified dementia without behavioral disturbance: Secondary | ICD-10-CM | POA: Insufficient documentation

## 2013-10-22 DIAGNOSIS — IMO0002 Reserved for concepts with insufficient information to code with codable children: Secondary | ICD-10-CM | POA: Insufficient documentation

## 2013-10-22 DIAGNOSIS — Z791 Long term (current) use of non-steroidal anti-inflammatories (NSAID): Secondary | ICD-10-CM | POA: Diagnosis not present

## 2013-10-22 DIAGNOSIS — M129 Arthropathy, unspecified: Secondary | ICD-10-CM | POA: Diagnosis not present

## 2013-10-22 DIAGNOSIS — I2510 Atherosclerotic heart disease of native coronary artery without angina pectoris: Secondary | ICD-10-CM | POA: Diagnosis not present

## 2013-10-22 DIAGNOSIS — Z9889 Other specified postprocedural states: Secondary | ICD-10-CM | POA: Insufficient documentation

## 2013-10-22 HISTORY — DX: Unspecified osteoarthritis, unspecified site: M19.90

## 2013-10-22 HISTORY — DX: Atherosclerotic heart disease of native coronary artery without angina pectoris: I25.10

## 2013-10-22 HISTORY — DX: Cerebral infarction, unspecified: I63.9

## 2013-10-22 LAB — TROPONIN I
Troponin I: <0.3 ng/mL (ref <0.30)
Troponin I: <0.3 ng/mL (ref <0.30)

## 2013-10-22 LAB — BASIC METABOLIC PANEL
BUN: 39 mg/dL — AB (ref 6–23)
CHLORIDE: 98 meq/L (ref 96–112)
CO2: 26 meq/L (ref 19–32)
CREATININE: 1.6 mg/dL — AB (ref 0.50–1.10)
Calcium: 10.6 mg/dL — ABNORMAL HIGH (ref 8.4–10.5)
GFR calc Af Amer: 33 mL/min — ABNORMAL LOW (ref >90)
GFR calc non Af Amer: 28 mL/min — ABNORMAL LOW (ref >90)
Glucose, Bld: 95 mg/dL (ref 70–99)
Potassium: 5.3 mEq/L (ref 3.7–5.3)
Sodium: 139 mEq/L (ref 137–147)

## 2013-10-22 LAB — HEPATIC FUNCTION PANEL
ALBUMIN: 4.1 g/dL (ref 3.5–5.2)
ALK PHOS: 75 U/L (ref 39–117)
ALT: 20 U/L (ref 0–35)
AST: 33 U/L (ref 0–37)
Bilirubin, Direct: <0.2 mg/dL (ref 0.0–0.3)
Total Bilirubin: 0.5 mg/dL (ref 0.3–1.2)
Total Protein: 7.8 g/dL (ref 6.0–8.3)

## 2013-10-22 LAB — URINALYSIS, ROUTINE W REFLEX MICROSCOPIC
BILIRUBIN URINE: NEGATIVE
Glucose, UA: NEGATIVE mg/dL
Hgb urine dipstick: NEGATIVE
Ketones, ur: NEGATIVE mg/dL
LEUKOCYTES UA: NEGATIVE
Nitrite: NEGATIVE
Protein, ur: NEGATIVE mg/dL
SPECIFIC GRAVITY, URINE: 1.015 (ref 1.005–1.030)
UROBILINOGEN UA: 0.2 mg/dL (ref 0.0–1.0)
pH: 7 (ref 5.0–8.0)

## 2013-10-22 LAB — PRO B NATRIURETIC PEPTIDE: PRO B NATRI PEPTIDE: 1753 pg/mL — AB (ref 0–450)

## 2013-10-22 LAB — CBC
HCT: 35.5 % — ABNORMAL LOW (ref 36.0–46.0)
Hemoglobin: 11.7 g/dL — ABNORMAL LOW (ref 12.0–15.0)
MCH: 29.5 pg (ref 26.0–34.0)
MCHC: 33 g/dL (ref 30.0–36.0)
MCV: 89.4 fL (ref 78.0–100.0)
Platelets: 166 10*3/uL (ref 150–400)
RBC: 3.97 MIL/uL (ref 3.87–5.11)
RDW: 16.1 % — ABNORMAL HIGH (ref 11.5–15.5)
WBC: 6.5 10*3/uL (ref 4.0–10.5)

## 2013-10-22 LAB — I-STAT TROPONIN, ED: TROPONIN I, POC: 0.02 ng/mL (ref 0.00–0.08)

## 2013-10-22 LAB — LIPASE, BLOOD: Lipase: 94 U/L — ABNORMAL HIGH (ref 11–59)

## 2013-10-22 MED ORDER — FUROSEMIDE 10 MG/ML IJ SOLN
20.0000 mg | Freq: Once | INTRAMUSCULAR | Status: AC
  Administered 2013-10-22: 20 mg via INTRAVENOUS
  Filled 2013-10-22: qty 2

## 2013-10-22 NOTE — Discharge Instructions (Signed)
Please call your doctor for a followup appointment within 24-48 hours. When you talk to your doctor please let them know that you were seen in the emergency department and have them acquire all of your records so that they can discuss the findings with you and formulate a treatment plan to fully care for your new and ongoing problems. Please call and set-up an appointment with your primary care provider to be seen and re-assessed Please rest and stay hydrated If chest pain is to continue within 24 hours please report back to the ED Please call and set-up an appointment with your cardiologist to be seen and re-assessed  Please continue to monitor symptoms closely and if symptoms are to worsen or change (fever greater than 101, chills, sweating, nausea, vomiting, chest pain, shortness of breath, difficulty breathing, numbness, tingling, worsening or changes to symptoms, changes in mental status, fall, fainting, injury) please report back to the ED    Chest Pain (Nonspecific) It is often hard to give a specific diagnosis for the cause of chest pain. There is always a chance that your pain could be related to something serious, such as a heart attack or a blood clot in the lungs. You need to follow up with your caregiver for further evaluation. CAUSES   Heartburn.  Pneumonia or bronchitis.  Anxiety or stress.  Inflammation around your heart (pericarditis) or lung (pleuritis or pleurisy).  A blood clot in the lung.  A collapsed lung (pneumothorax). It can develop suddenly on its own (spontaneous pneumothorax) or from injury (trauma) to the chest.  Shingles infection (herpes zoster virus). The chest wall is composed of bones, muscles, and cartilage. Any of these can be the source of the pain.  The bones can be bruised by injury.  The muscles or cartilage can be strained by coughing or overwork.  The cartilage can be affected by inflammation and become sore (costochondritis). DIAGNOSIS  Lab  tests or other studies, such as X-rays, electrocardiography, stress testing, or cardiac imaging, may be needed to find the cause of your pain.  TREATMENT   Treatment depends on what may be causing your chest pain. Treatment may include:  Acid blockers for heartburn.  Anti-inflammatory medicine.  Pain medicine for inflammatory conditions.  Antibiotics if an infection is present.  You may be advised to change lifestyle habits. This includes stopping smoking and avoiding alcohol, caffeine, and chocolate.  You may be advised to keep your head raised (elevated) when sleeping. This reduces the chance of acid going backward from your stomach into your esophagus.  Most of the time, nonspecific chest pain will improve within 2 to 3 days with rest and mild pain medicine. HOME CARE INSTRUCTIONS   If antibiotics were prescribed, take your antibiotics as directed. Finish them even if you start to feel better.  For the next few days, avoid physical activities that bring on chest pain. Continue physical activities as directed.  Do not smoke.  Avoid drinking alcohol.  Only take over-the-counter or prescription medicine for pain, discomfort, or fever as directed by your caregiver.  Follow your caregiver's suggestions for further testing if your chest pain does not go away.  Keep any follow-up appointments you made. If you do not go to an appointment, you could develop lasting (chronic) problems with pain. If there is any problem keeping an appointment, you must call to reschedule. SEEK MEDICAL CARE IF:   You think you are having problems from the medicine you are taking. Read your medicine instructions  carefully.  Your chest pain does not go away, even after treatment.  You develop a rash with blisters on your chest. SEEK IMMEDIATE MEDICAL CARE IF:   You have increased chest pain or pain that spreads to your arm, neck, jaw, back, or abdomen.  You develop shortness of breath, an increasing  cough, or you are coughing up blood.  You have severe back or abdominal pain, feel nauseous, or vomit.  You develop severe weakness, fainting, or chills.  You have a fever. THIS IS AN EMERGENCY. Do not wait to see if the pain will go away. Get medical help at once. Call your local emergency services (911 in [place] Do not drive yourself to the hospital. MAKE SURE YOU:   Understand these instructions.  Will watch your condition.  Will get help right away if you are not doing well or get worse. Document Released: 02/20/2005 Document Revised: 08/05/2011 Document Reviewed: 12/17/2007 [facility] Patient Information 2014 [place].

## 2013-10-22 NOTE — ED Notes (Signed)
EKG completed given to EDP.  

## 2013-10-22 NOTE — ED Notes (Signed)
[employer] called report via phone past maker function working fine with 1:1 conduction atrial pacing. Last atrial arrhythmia May 9th. 2 years remaining on battery. Last home check April and in person check Jan 2011. Notified provider.

## 2013-10-22 NOTE — ED Notes (Signed)
Received fax from [employer] given to provider.

## 2013-10-22 NOTE — ED Provider Notes (Signed)
Delta troponin negative. No new complaints. Will discharge.  Ephraim Hamburger, MD 10/22/13 [phone]

## 2013-10-22 NOTE — ED Provider Notes (Signed)
Medical screening examination/treatment/procedure(s) were conducted as a shared visit with non-physician practitioner(s) and myself.  I personally evaluated the patient during the encounter.   EKG Interpretation None     ECG Muse not working: Vent. rate 86 BPM PR interval 334 ms QRS duration 96 ms QT/QTc 356/426 ms P-R-T axes * 72 -89 Atrial-paced rhythm with prolonged AV conduction Incomplete right bundle branch block ST & T wave abnormality, consider inferior ischemia ST & T wave abnormality, consider anterolateral ischemia Compared to previous tracing 2006 paced rhythm now present Confirmed by [facility] MD, [name] ([id number]) on 10/22/2013 12:87:19 PM  80 year old female with baseline dementia is now asymptomatic is now back to baseline but had a vague episode of chest pain and shortness of breath prior to arrival this morning unknown how long it lasted unknown of the duration quality or quantity or radiation of the sensation.  Brittany Relic, MD 10/24/13 0001

## 2013-10-22 NOTE — ED Provider Notes (Signed)
CSN: [id number]     Arrival date & time 10/22/13  1131 History   First MD Initiated Contact with Patient 10/22/13 1132     Chief Complaint  Patient presents with  . Chest Pain     (Consider location/radiation/quality/duration/timing/severity/associated sxs/prior Treatment) The history is provided by the spouse. No language interpreter was used.  Level 5 caveat VONCILE SCHWARZ is an 80 y/o F with PMHx of dementia, arthritis, stroke, CAD, pacemaker placement - Medronic, pulmonary HTN, subdural hematoma 15 years ago with stroke 2 years after the event that led to left sided weakness presenting to the ED with chest pain and shortness of breath that started this morning at approximately 9:50AM while patient was getting her hair done. As per husband's report, stated that he received a phone call at this time regarding that the hairdresser reported that the patient did not look well and that the patient voiced that she was not feeling that great. As per husband, patient reported that she was having discomfort in her chest. Husband stated that he picked his wife up and was going to drive to [facility], but instead went to [facility] [employer] where patient was placed on oxygen. EMS came brought [facility] to [facility] - EMS administered 324 mg of ASA while en route to the ED. Husband denied changes to personality, fever, syncope, changes to personality, mental status change. As per husband, reported that patient was fine yesterday.  PCP Dr. Gilford Rile   Past Medical History  Diagnosis Date  . Arthritis   . Stroke   . Coronary artery disease    Past Surgical History  Procedure Laterality Date  . Cardiac surgery      pace maker   . Brain surgery      subdural hematoma   History reviewed. No pertinent family history. History  Substance Use Topics  . Smoking status: Never Smoker   . Smokeless tobacco: Former Systems developer    Types: Snuff  . Alcohol Use: No   OB History   Grav Para Term Preterm Abortions TAB SAB Ect  Mult Living                 Review of Systems  Unable to perform ROS: Dementia      Allergies  Tape  Home Medications   Prior to Admission medications   Medication Sig Start Date End Date Taking? Authorizing Provider  ambrisentan (LETAIRIS) 5 MG tablet Take 5 mg by mouth every morning.   Yes Historical Provider, MD  aspirin EC 81 MG tablet Take 162 mg by mouth every morning.   Yes Historical Provider, MD  Calcium-Vitamin D 600-200 MG-UNIT per tablet Take 1 tablet by mouth daily.   Yes Historical Provider, MD  celecoxib (CELEBREX) 200 MG capsule Take 200 mg by mouth daily.   Yes Historical Provider, MD  diltiazem (CARTIA XT) 180 MG 24 hr capsule Take 180 mg by mouth daily.   Yes Historical Provider, MD  donepezil (ARICEPT) 5 MG tablet Take 5 mg by mouth at bedtime.   Yes Historical Provider, MD  estradiol (ESTRACE) 0.1 MG/GM vaginal cream Place 1 Applicatorful vaginally every Monday, Wednesday, and Friday.   Yes Historical Provider, MD  Fluticasone-Salmeterol (ADVAIR) 100-50 MCG/DOSE AEPB Inhale 1 puff into the lungs 2 (two) times daily.   Yes Historical Provider, MD  furosemide (LASIX) 80 MG tablet Take 80 mg by mouth every other day.   Yes Historical Provider, MD  hydrochlorothiazide (HYDRODIURIL) 25 MG tablet Take 25 mg by  mouth every morning.   Yes Historical Provider, MD  metoprolol (LOPRESSOR) 100 MG tablet Take 100 mg by mouth 2 (two) times daily.   Yes Historical Provider, MD  Multiple Vitamins-Minerals (MULTIVITAMIN PO) Take 1 tablet by mouth every morning.   Yes Historical Provider, MD  polycarbophil (FIBERCON) 625 MG tablet Take 625 mg by mouth every morning.   Yes Historical Provider, MD  sertraline (ZOLOFT) 50 MG tablet Take 50 mg by mouth every evening.   Yes Historical Provider, MD  spironolactone (ALDACTONE) 25 MG tablet Take 25 mg by mouth every morning.   Yes Historical Provider, MD   BP 106/43  Pulse 70  Temp(Src) 97.9 F (36.6 C) (Oral)  Resp 17  SpO2  99% Physical Exam  Nursing note and vitals reviewed. Constitutional: She appears well-developed and well-nourished. No distress.  HENT:  Head: Normocephalic and atraumatic.  Mouth/Throat: Oropharynx is clear and moist. No oropharyngeal exudate.  Eyes: Conjunctivae and EOM are normal. Pupils are equal, round, and reactive to light. Right eye exhibits no discharge. Left eye exhibits no discharge.  Neck: Normal range of motion. Neck supple. No tracheal deviation present.  Cardiovascular: Normal rate, regular rhythm and normal heart sounds.  Exam reveals no friction rub.   No murmur heard. Cap refill < 3 seconds Negative swelling or pitting edema noted to the lower extremities bilaterally   Pulmonary/Chest: Effort normal and breath sounds normal. No respiratory distress. She has no wheezes. She has no rales. She exhibits no tenderness.  Patient is able to speak in full sentences without difficulty  Negative use of accessory muscles Negative stridor Negative drooling Negative signs of respiratory distress  Musculoskeletal: Normal range of motion.  Full ROM to upper and lower extremities without difficulty noted, negative ataxia noted.  Lymphadenopathy:    She has no cervical adenopathy.  Neurological: She is alert. No cranial nerve deficit. She exhibits normal muscle tone. Coordination normal.  Cranial nerves III-XII grossly intact Patient disoriented - history of dementia Strength 5+/5+ to upper and lower extremities bilaterally with resistance applied, equal distribution noted Equal grip strength bilaterally  Negative slurred speech  Patient follows commands well Negative facial droop Negative aphasia  Skin: Skin is warm and dry. No rash noted. She is not diaphoretic. No erythema.  Psychiatric: She has a normal mood and affect. Her behavior is normal. Thought content normal.    ED Course  Procedures (including critical care time)  12:28 PM patient seen and assessed by attending  physician, Dr. Jinger Neighbors. Reported that since patient did not have a syncopal episode - did not recommend pacemaker to be interrogated. As per attending - recommended troponins to be performed at 0, 3, 6 and for patient to follow-up as outpatient with cardiologist.   Results for orders placed during the hospital encounter of 10/22/13  CBC      Result Value Ref Range   WBC 6.5  4.0 - 10.5 K/uL   RBC 3.97  3.87 - 5.11 MIL/uL   Hemoglobin 11.7 (*) 12.0 - 15.0 g/dL   HCT 07.8 (*) 67.5 - 44.9 %   MCV 89.4  78.0 - 100.0 fL   MCH 29.5  26.0 - 34.0 pg   MCHC 33.0  30.0 - 36.0 g/dL   RDW 20.1 (*) 00.7 - 12.1 %   Platelets 166  150 - 400 K/uL  BASIC METABOLIC PANEL      Result Value Ref Range   Sodium 139  137 - 147 mEq/L   Potassium 5.3  3.7 - 5.3 mEq/L   Chloride 98  96 - 112 mEq/L   CO2 26  19 - 32 mEq/L   Glucose, Bld 95  70 - 99 mg/dL   BUN 39 (*) 6 - 23 mg/dL   Creatinine, Ser 1.60 (*) 0.50 - 1.10 mg/dL   Calcium 10.6 (*) 8.4 - 10.5 mg/dL   GFR calc non Af Amer 28 (*) >90 mL/min   GFR calc Af Amer 33 (*) >90 mL/min  TROPONIN I      Result Value Ref Range   Troponin I <0.30  <0.30 ng/mL  URINALYSIS, ROUTINE W REFLEX MICROSCOPIC      Result Value Ref Range   Color, Urine YELLOW  YELLOW   APPearance CLEAR  CLEAR   Specific Gravity, Urine 1.015  1.005 - 1.030   pH 7.0  5.0 - 8.0   Glucose, UA NEGATIVE  NEGATIVE mg/dL   Hgb urine dipstick NEGATIVE  NEGATIVE   Bilirubin Urine NEGATIVE  NEGATIVE   Ketones, ur NEGATIVE  NEGATIVE mg/dL   Protein, ur NEGATIVE  NEGATIVE mg/dL   Urobilinogen, UA 0.2  0.0 - 1.0 mg/dL   Nitrite NEGATIVE  NEGATIVE   Leukocytes, UA NEGATIVE  NEGATIVE  HEPATIC FUNCTION PANEL      Result Value Ref Range   Total Protein 7.8  6.0 - 8.3 g/dL   Albumin 4.1  3.5 - 5.2 g/dL   AST 33  0 - 37 U/L   ALT 20  0 - 35 U/L   Alkaline Phosphatase 75  39 - 117 U/L   Total Bilirubin 0.5  0.3 - 1.2 mg/dL   Bilirubin, Direct <0.2  0.0 - 0.3 mg/dL   Indirect Bilirubin NOT  CALCULATED  0.3 - 0.9 mg/dL  PRO B NATRIURETIC PEPTIDE      Result Value Ref Range   Pro B Natriuretic peptide (BNP) 1753.0 (*) 0 - 450 pg/mL  TROPONIN I      Result Value Ref Range   Troponin I <0.30  <0.30 ng/mL  LIPASE, BLOOD      Result Value Ref Range   Lipase 94 (*) 11 - 59 U/L  I-STAT TROPOININ, ED      Result Value Ref Range   Troponin i, poc 0.02  0.00 - 0.08 ng/mL   Comment 3             Labs Review Labs Reviewed  CBC - Abnormal; Notable for the following:    Hemoglobin 11.7 (*)    HCT 35.5 (*)    RDW 16.1 (*)    All other components within normal limits  BASIC METABOLIC PANEL - Abnormal; Notable for the following:    BUN 39 (*)    Creatinine, Ser 1.60 (*)    Calcium 10.6 (*)    GFR calc non Af Amer 28 (*)    GFR calc Af Amer 33 (*)    All other components within normal limits  PRO B NATRIURETIC PEPTIDE - Abnormal; Notable for the following:    Pro B Natriuretic peptide (BNP) 1753.0 (*)    All other components within normal limits  LIPASE, BLOOD - Abnormal; Notable for the following:    Lipase 94 (*)    All other components within normal limits  TROPONIN I  URINALYSIS, ROUTINE W REFLEX MICROSCOPIC  HEPATIC FUNCTION PANEL  TROPONIN I  TROPONIN I  Randolm Idol, ED    Imaging Review Dg Chest Port 1 View  10/22/2013   CLINICAL DATA:  Weakness  and chest pain  EXAM: PORTABLE CHEST - 1 VIEW  COMPARISON:  08/16/2006  FINDINGS: The cardiac shadow is stable. A pacing device is now seen. Postsurgical changes are noted in the lower cervical and upper thoracic spine. The lungs are well aerated bilaterally with mild interstitial changes. No focal infiltrate or sizable effusion is seen. Calcified lymph node is noted in the left axilla and stable. Old rib fractures are noted on the right.  IMPRESSION: Chronic changes without acute abnormality.   Electronically Signed   By: Inez Catalina M.D.   On: 10/22/2013 12:21     EKG Interpretation None      MDM   Final  diagnoses:  None   Filed Vitals:   10/22/13 1730 10/22/13 1800 10/22/13 1818 10/22/13 1916  BP: 193/103 110/66 106/43 115/48  Pulse: 72 70  69  Temp:   97.9 F (36.6 C)   TempSrc:   Oral   Resp: 24 13 17 14   SpO2: 100% 97% 99% 100%    EKG noted atrial paced rhythm with prolonged AV conduction. Troponin negative elevation. Second troponin negative elevation. BNP 1753.0. BMP noted elevated BUN of 39 and Creatinine of 1.60. Hypercalcemia of 10.6. Hepatic function panel negative findings. Lipase mildly elevated at 94. UA negative for infection - negative nitrites or leukocytes noted. Negative Hgb noted in urine. Chest xray negative for acute findings - chronic changes noted without acute abnormalities.  PM interrogated with negative findings - normal function noted.  Patient not hypoxic, negative tachycardia or tachypnea noted. Negative signs of respiratory distress.  Patient seen and assessed by attending physician, Dr. Rebeca Alert, who recommended patient to have troponins and cardiac monitoring - as per attending physician, anticipates discharge. Patient asymptomatic at this time. Negative signs of respiratory distress.  Plan: Troponins pending. Discussed case with Dr. Tyron Russell. Monitor troponin, vitals. Transfer of care to Dr. Tyron Russell at change in shift.  Jamse Mead, PA-C 10/22/13 1941  Jamse Mead, PA-C 10/25/13 1453

## 2013-10-22 NOTE — ED Notes (Signed)
Per EMS, pt was taken to her hairdresser at 0900 this morning by her husband, he was called at 091 and was told the patient was feeling bad and experiencing chest pain. EMS was called and performed and EKG, T wave depression was noticed and EMS was instructed to bring patient to [facility] ED. Pt has h/o dementia and seems confused. Unsure of baseline at this time.

## 2013-11-01 ENCOUNTER — Ambulatory Visit: Payer: Self-pay | Admitting: Internal Medicine

## 2013-11-03 ENCOUNTER — Ambulatory Visit: Payer: Self-pay | Admitting: Internal Medicine

## 2013-11-04 ENCOUNTER — Ambulatory Visit: Payer: Self-pay | Admitting: Internal Medicine

## 2013-11-04 DIAGNOSIS — C50919 Malignant neoplasm of unspecified site of unspecified female breast: Secondary | ICD-10-CM

## 2013-11-04 HISTORY — DX: Malignant neoplasm of unspecified site of unspecified female breast: C50.919

## 2013-11-16 ENCOUNTER — Ambulatory Visit: Payer: Self-pay | Admitting: Internal Medicine

## 2013-11-17 LAB — PATHOLOGY REPORT

## 2013-11-24 ENCOUNTER — Ambulatory Visit: Payer: Self-pay | Admitting: Internal Medicine

## 2013-12-08 ENCOUNTER — Ambulatory Visit: Payer: Self-pay | Admitting: Surgery

## 2013-12-14 ENCOUNTER — Ambulatory Visit: Payer: Self-pay | Admitting: Surgery

## 2013-12-17 LAB — PATHOLOGY REPORT

## 2013-12-25 ENCOUNTER — Observation Stay: Payer: Self-pay

## 2013-12-25 LAB — COMPREHENSIVE METABOLIC PANEL
ANION GAP: 5 — AB (ref 7–16)
Albumin: 3.7 g/dL (ref 3.4–5.0)
Alkaline Phosphatase: 72 U/L
BILIRUBIN TOTAL: 0.4 mg/dL (ref 0.2–1.0)
BUN: 48 mg/dL — ABNORMAL HIGH (ref 7–18)
CALCIUM: 8.8 mg/dL (ref 8.5–10.1)
CREATININE: 2.33 mg/dL — AB (ref 0.60–1.30)
Chloride: 105 mmol/L (ref 98–107)
Co2: 29 mmol/L (ref 21–32)
EGFR (African American): 21 — ABNORMAL LOW
EGFR (Non-African Amer.): 18 — ABNORMAL LOW
GLUCOSE: 114 mg/dL — AB (ref 65–99)
OSMOLALITY: 291 (ref 275–301)
Potassium: 4.1 mmol/L (ref 3.5–5.1)
SGOT(AST): 49 U/L — ABNORMAL HIGH (ref 15–37)
SGPT (ALT): 38 U/L
Sodium: 139 mmol/L (ref 136–145)
TOTAL PROTEIN: 7.6 g/dL (ref 6.4–8.2)

## 2013-12-25 LAB — URINALYSIS, COMPLETE
BILIRUBIN, UR: NEGATIVE
Blood: NEGATIVE
GLUCOSE, UR: NEGATIVE mg/dL (ref 0–75)
KETONE: NEGATIVE
LEUKOCYTE ESTERASE: NEGATIVE
Nitrite: NEGATIVE
PH: 6 (ref 4.5–8.0)
Protein: 30
RBC,UR: NONE SEEN /HPF (ref 0–5)
Specific Gravity: 1.005 (ref 1.003–1.030)
WBC UR: NONE SEEN /HPF (ref 0–5)

## 2013-12-25 LAB — CBC WITH DIFFERENTIAL/PLATELET
Basophil #: 0 10*3/uL (ref 0.0–0.1)
Basophil %: 0.4 %
EOS PCT: 1.8 %
Eosinophil #: 0.1 10*3/uL (ref 0.0–0.7)
HCT: 35.2 % (ref 35.0–47.0)
HGB: 11.4 g/dL — AB (ref 12.0–16.0)
Lymphocyte #: 1 10*3/uL (ref 1.0–3.6)
Lymphocyte %: 15.7 %
MCH: 29.7 pg (ref 26.0–34.0)
MCHC: 32.3 g/dL (ref 32.0–36.0)
MCV: 92 fL (ref 80–100)
MONOS PCT: 9.6 %
Monocyte #: 0.6 x10 3/mm (ref 0.2–0.9)
NEUTROS PCT: 72.5 %
Neutrophil #: 4.8 10*3/uL (ref 1.4–6.5)
PLATELETS: 178 10*3/uL (ref 150–440)
RBC: 3.82 10*6/uL (ref 3.80–5.20)
RDW: 15 % — AB (ref 11.5–14.5)
WBC: 6.6 10*3/uL (ref 3.6–11.0)

## 2013-12-25 LAB — LIPASE, BLOOD: Lipase: 324 U/L (ref 73–393)

## 2013-12-26 LAB — BASIC METABOLIC PANEL
Anion Gap: 9 (ref 7–16)
BUN: 39 mg/dL — AB (ref 7–18)
CALCIUM: 7.9 mg/dL — AB (ref 8.5–10.1)
CREATININE: 1.9 mg/dL — AB (ref 0.60–1.30)
Chloride: 103 mmol/L (ref 98–107)
Co2: 30 mmol/L (ref 21–32)
EGFR (African American): 27 — ABNORMAL LOW
GFR CALC NON AF AMER: 24 — AB
GLUCOSE: 97 mg/dL (ref 65–99)
Osmolality: 292 (ref 275–301)
Potassium: 3.6 mmol/L (ref 3.5–5.1)
Sodium: 142 mmol/L (ref 136–145)

## 2013-12-28 ENCOUNTER — Ambulatory Visit: Payer: Self-pay | Admitting: Radiation Oncology

## 2014-01-19 LAB — CBC CANCER CENTER
BASOS ABS: 0 x10 3/mm (ref 0.0–0.1)
Basophil %: 0.3 %
EOS ABS: 0.2 x10 3/mm (ref 0.0–0.7)
EOS PCT: 2.4 %
HCT: 35 % (ref 35.0–47.0)
HGB: 11.6 g/dL — ABNORMAL LOW (ref 12.0–16.0)
Lymphocyte #: 0.8 x10 3/mm — ABNORMAL LOW (ref 1.0–3.6)
Lymphocyte %: 12.2 %
MCH: 30.1 pg (ref 26.0–34.0)
MCHC: 33 g/dL (ref 32.0–36.0)
MCV: 91 fL (ref 80–100)
Monocyte #: 0.5 x10 3/mm (ref 0.2–0.9)
Monocyte %: 8.7 %
Neutrophil #: 4.8 x10 3/mm (ref 1.4–6.5)
Neutrophil %: 76.4 %
Platelet: 158 x10 3/mm (ref 150–440)
RBC: 3.84 10*6/uL (ref 3.80–5.20)
RDW: 14.1 % (ref 11.5–14.5)
WBC: 6.3 x10 3/mm (ref 3.6–11.0)

## 2014-01-25 ENCOUNTER — Ambulatory Visit: Payer: Self-pay | Admitting: Radiation Oncology

## 2014-01-26 LAB — CBC CANCER CENTER
Basophil #: 0.1 x10 3/mm (ref 0.0–0.1)
Basophil %: 1 %
EOS PCT: 4 %
Eosinophil #: 0.2 x10 3/mm (ref 0.0–0.7)
HCT: 34.9 % — ABNORMAL LOW (ref 35.0–47.0)
HGB: 11.4 g/dL — AB (ref 12.0–16.0)
LYMPHS ABS: 0.8 x10 3/mm — AB (ref 1.0–3.6)
Lymphocyte %: 12.9 %
MCH: 29.9 pg (ref 26.0–34.0)
MCHC: 32.6 g/dL (ref 32.0–36.0)
MCV: 92 fL (ref 80–100)
Monocyte #: 0.6 x10 3/mm (ref 0.2–0.9)
Monocyte %: 10.1 %
NEUTROS ABS: 4.4 x10 3/mm (ref 1.4–6.5)
Neutrophil %: 72 %
Platelet: 164 x10 3/mm (ref 150–440)
RBC: 3.8 10*6/uL (ref 3.80–5.20)
RDW: 14.5 % (ref 11.5–14.5)
WBC: 6.1 x10 3/mm (ref 3.6–11.0)

## 2014-02-02 LAB — CBC CANCER CENTER
Basophil #: 0 x10 3/mm (ref 0.0–0.1)
Basophil %: 0.9 %
Eosinophil #: 0.2 x10 3/mm (ref 0.0–0.7)
Eosinophil %: 4.1 %
HCT: 36.1 % (ref 35.0–47.0)
HGB: 11.8 g/dL — ABNORMAL LOW (ref 12.0–16.0)
LYMPHS PCT: 13.9 %
Lymphocyte #: 0.7 x10 3/mm — ABNORMAL LOW (ref 1.0–3.6)
MCH: 30.1 pg (ref 26.0–34.0)
MCHC: 32.8 g/dL (ref 32.0–36.0)
MCV: 92 fL (ref 80–100)
Monocyte #: 0.6 x10 3/mm (ref 0.2–0.9)
Monocyte %: 10.9 %
NEUTROS PCT: 70.2 %
Neutrophil #: 3.6 x10 3/mm (ref 1.4–6.5)
PLATELETS: 178 x10 3/mm (ref 150–440)
RBC: 3.94 10*6/uL (ref 3.80–5.20)
RDW: 14.8 % — ABNORMAL HIGH (ref 11.5–14.5)
WBC: 5.1 x10 3/mm (ref 3.6–11.0)

## 2014-02-03 ENCOUNTER — Inpatient Hospital Stay: Payer: Self-pay | Admitting: Internal Medicine

## 2014-02-03 LAB — TROPONIN I: Troponin-I: <0.02 ng/mL

## 2014-02-03 LAB — URINALYSIS, COMPLETE
BILIRUBIN, UR: NEGATIVE
Blood: NEGATIVE
GLUCOSE, UR: NEGATIVE mg/dL (ref 0–75)
Hyaline Cast: 10
Ketone: NEGATIVE
Nitrite: NEGATIVE
PROTEIN: NEGATIVE
Ph: 5 (ref 4.5–8.0)
Specific Gravity: 1.012 (ref 1.003–1.030)
Squamous Epithelial: <1
WBC UR: 3 /HPF (ref 0–5)

## 2014-02-03 LAB — PRO B NATRIURETIC PEPTIDE: B-TYPE NATIURETIC PEPTID: 2642 pg/mL — AB (ref 0–450)

## 2014-02-03 LAB — COMPREHENSIVE METABOLIC PANEL
ALK PHOS: 58 U/L
ALT: 22 U/L
ANION GAP: 6 — AB (ref 7–16)
AST: 35 U/L (ref 15–37)
Albumin: 3.4 g/dL (ref 3.4–5.0)
BILIRUBIN TOTAL: 0.4 mg/dL (ref 0.2–1.0)
BUN: 47 mg/dL — ABNORMAL HIGH (ref 7–18)
CREATININE: 1.9 mg/dL — AB (ref 0.60–1.30)
Calcium, Total: 8.8 mg/dL (ref 8.5–10.1)
Chloride: 101 mmol/L (ref 98–107)
Co2: 33 mmol/L — ABNORMAL HIGH (ref 21–32)
EGFR (African American): 27 — ABNORMAL LOW
EGFR (Non-African Amer.): 24 — ABNORMAL LOW
GLUCOSE: 89 mg/dL (ref 65–99)
OSMOLALITY: 291 (ref 275–301)
POTASSIUM: 4 mmol/L (ref 3.5–5.1)
Sodium: 140 mmol/L (ref 136–145)
TOTAL PROTEIN: 6.9 g/dL (ref 6.4–8.2)

## 2014-02-03 LAB — CBC
HCT: 33.4 % — AB (ref 35.0–47.0)
HGB: 11 g/dL — AB (ref 12.0–16.0)
MCH: 30.4 pg (ref 26.0–34.0)
MCHC: 32.9 g/dL (ref 32.0–36.0)
MCV: 93 fL (ref 80–100)
Platelet: 155 10*3/uL (ref 150–440)
RBC: 3.61 10*6/uL — AB (ref 3.80–5.20)
RDW: 14.7 % — AB (ref 11.5–14.5)
WBC: 5.2 10*3/uL (ref 3.6–11.0)

## 2014-02-03 LAB — CALCIUM: Calcium, Total: 9 mg/dL (ref 8.5–10.1)

## 2014-02-03 LAB — ACETAMINOPHEN LEVEL

## 2014-02-03 LAB — SALICYLATE LEVEL

## 2014-02-04 LAB — BASIC METABOLIC PANEL
ANION GAP: 10 (ref 7–16)
BUN: 36 mg/dL — ABNORMAL HIGH (ref 7–18)
CO2: 25 mmol/L (ref 21–32)
CREATININE: 1.43 mg/dL — AB (ref 0.60–1.30)
Calcium, Total: 11.4 mg/dL — ABNORMAL HIGH (ref 8.5–10.1)
Chloride: 110 mmol/L — ABNORMAL HIGH (ref 98–107)
EGFR (African American): 39 — ABNORMAL LOW
EGFR (Non-African Amer.): 33 — ABNORMAL LOW
GLUCOSE: 106 mg/dL — AB (ref 65–99)
Osmolality: 297 (ref 275–301)
Potassium: 3.6 mmol/L (ref 3.5–5.1)
Sodium: 145 mmol/L (ref 136–145)

## 2014-02-04 LAB — CBC WITH DIFFERENTIAL/PLATELET
Basophil #: 0 10*3/uL (ref 0.0–0.1)
Basophil %: 0.3 %
EOS PCT: 3.9 %
Eosinophil #: 0.2 10*3/uL (ref 0.0–0.7)
HCT: 28.7 % — ABNORMAL LOW (ref 35.0–47.0)
HGB: 9.5 g/dL — ABNORMAL LOW (ref 12.0–16.0)
LYMPHS ABS: 0.4 10*3/uL — AB (ref 1.0–3.6)
Lymphocyte %: 8.2 %
MCH: 30.5 pg (ref 26.0–34.0)
MCHC: 33.1 g/dL (ref 32.0–36.0)
MCV: 92 fL (ref 80–100)
Monocyte #: 0.4 x10 3/mm (ref 0.2–0.9)
Monocyte %: 6.5 %
NEUTROS ABS: 4.4 10*3/uL (ref 1.4–6.5)
Neutrophil %: 81.1 %
Platelet: 117 10*3/uL — ABNORMAL LOW (ref 150–440)
RBC: 3.11 10*6/uL — AB (ref 3.80–5.20)
RDW: 14.5 % (ref 11.5–14.5)
WBC: 5.4 10*3/uL (ref 3.6–11.0)

## 2014-02-05 LAB — CBC WITH DIFFERENTIAL/PLATELET
BASOS ABS: 0 10*3/uL (ref 0.0–0.1)
Basophil %: 0.5 %
Eosinophil #: 0.2 10*3/uL (ref 0.0–0.7)
Eosinophil %: 4.2 %
HCT: 25.5 % — ABNORMAL LOW (ref 35.0–47.0)
HGB: 8.3 g/dL — ABNORMAL LOW (ref 12.0–16.0)
LYMPHS PCT: 9.1 %
Lymphocyte #: 0.4 10*3/uL — ABNORMAL LOW (ref 1.0–3.6)
MCH: 30.4 pg (ref 26.0–34.0)
MCHC: 32.7 g/dL (ref 32.0–36.0)
MCV: 93 fL (ref 80–100)
MONO ABS: 0.5 x10 3/mm (ref 0.2–0.9)
Monocyte %: 9.9 %
Neutrophil #: 3.7 10*3/uL (ref 1.4–6.5)
Neutrophil %: 76.3 %
PLATELETS: 93 10*3/uL — AB (ref 150–440)
RBC: 2.74 10*6/uL — ABNORMAL LOW (ref 3.80–5.20)
RDW: 14.9 % — AB (ref 11.5–14.5)
WBC: 4.8 10*3/uL (ref 3.6–11.0)

## 2014-02-05 LAB — BASIC METABOLIC PANEL
Anion Gap: 6 — ABNORMAL LOW (ref 7–16)
BUN: 27 mg/dL — ABNORMAL HIGH (ref 7–18)
Calcium, Total: 8.6 mg/dL (ref 8.5–10.1)
Chloride: 119 mmol/L — ABNORMAL HIGH (ref 98–107)
Co2: 23 mmol/L (ref 21–32)
Creatinine: 1.12 mg/dL (ref 0.60–1.30)
EGFR (Non-African Amer.): 45 — ABNORMAL LOW
GFR CALC AF AMER: 52 — AB
GLUCOSE: 92 mg/dL (ref 65–99)
OSMOLALITY: 299 (ref 275–301)
Potassium: 3.2 mmol/L — ABNORMAL LOW (ref 3.5–5.1)
Sodium: 148 mmol/L — ABNORMAL HIGH (ref 136–145)

## 2014-02-06 LAB — CBC WITH DIFFERENTIAL/PLATELET
BASOS ABS: 0 10*3/uL (ref 0.0–0.1)
Basophil %: 0.6 %
Eosinophil #: 0.3 10*3/uL (ref 0.0–0.7)
Eosinophil %: 3.8 %
HCT: 31.7 % — ABNORMAL LOW (ref 35.0–47.0)
HGB: 10.6 g/dL — ABNORMAL LOW (ref 12.0–16.0)
LYMPHS ABS: 1.2 10*3/uL (ref 1.0–3.6)
Lymphocyte %: 17.2 %
MCH: 30.8 pg (ref 26.0–34.0)
MCHC: 33.4 g/dL (ref 32.0–36.0)
MCV: 92 fL (ref 80–100)
MONOS PCT: 11.5 %
Monocyte #: 0.8 x10 3/mm (ref 0.2–0.9)
Neutrophil #: 4.7 10*3/uL (ref 1.4–6.5)
Neutrophil %: 66.9 %
PLATELETS: 113 10*3/uL — AB (ref 150–440)
RBC: 3.44 10*6/uL — AB (ref 3.80–5.20)
RDW: 15.5 % — ABNORMAL HIGH (ref 11.5–14.5)
WBC: 7 10*3/uL (ref 3.6–11.0)

## 2014-02-06 LAB — BASIC METABOLIC PANEL
Anion Gap: 8 (ref 7–16)
BUN: 24 mg/dL — AB (ref 7–18)
CHLORIDE: 112 mmol/L — AB (ref 98–107)
CO2: 24 mmol/L (ref 21–32)
CREATININE: 1.33 mg/dL — AB (ref 0.60–1.30)
Calcium, Total: 8.6 mg/dL (ref 8.5–10.1)
EGFR (Non-African Amer.): 36 — ABNORMAL LOW
GFR CALC AF AMER: 42 — AB
Glucose: 118 mg/dL — ABNORMAL HIGH (ref 65–99)
Osmolality: 292 (ref 275–301)
Potassium: 3.4 mmol/L — ABNORMAL LOW (ref 3.5–5.1)
SODIUM: 144 mmol/L (ref 136–145)

## 2014-02-07 LAB — BASIC METABOLIC PANEL
ANION GAP: 6 — AB (ref 7–16)
BUN: 25 mg/dL — ABNORMAL HIGH (ref 7–18)
CREATININE: 1.15 mg/dL (ref 0.60–1.30)
Calcium, Total: 8.8 mg/dL (ref 8.5–10.1)
Chloride: 111 mmol/L — ABNORMAL HIGH (ref 98–107)
Co2: 26 mmol/L (ref 21–32)
EGFR (African American): 50 — ABNORMAL LOW
EGFR (Non-African Amer.): 43 — ABNORMAL LOW
Glucose: 105 mg/dL — ABNORMAL HIGH (ref 65–99)
OSMOLALITY: 290 (ref 275–301)
POTASSIUM: 3.5 mmol/L (ref 3.5–5.1)
Sodium: 143 mmol/L (ref 136–145)

## 2014-02-07 LAB — CBC WITH DIFFERENTIAL/PLATELET
BASOS ABS: 0 10*3/uL (ref 0.0–0.1)
Basophil %: 0.6 %
EOS PCT: 2.9 %
Eosinophil #: 0.2 10*3/uL (ref 0.0–0.7)
HCT: 27.9 % — AB (ref 35.0–47.0)
HGB: 9.4 g/dL — AB (ref 12.0–16.0)
Lymphocyte #: 0.5 10*3/uL — ABNORMAL LOW (ref 1.0–3.6)
Lymphocyte %: 9.5 %
MCH: 30.6 pg (ref 26.0–34.0)
MCHC: 33.6 g/dL (ref 32.0–36.0)
MCV: 91 fL (ref 80–100)
MONOS PCT: 11.3 %
Monocyte #: 0.6 x10 3/mm (ref 0.2–0.9)
NEUTROS ABS: 3.9 10*3/uL (ref 1.4–6.5)
Neutrophil %: 75.7 %
Platelet: 95 10*3/uL — ABNORMAL LOW (ref 150–440)
RBC: 3.06 10*6/uL — ABNORMAL LOW (ref 3.80–5.20)
RDW: 15.1 % — ABNORMAL HIGH (ref 11.5–14.5)
WBC: 5.2 10*3/uL (ref 3.6–11.0)

## 2014-02-09 LAB — CBC CANCER CENTER
BASOS PCT: 0.5 %
Basophil #: 0 x10 3/mm (ref 0.0–0.1)
Eosinophil #: 0.1 x10 3/mm (ref 0.0–0.7)
Eosinophil %: 1.8 %
HCT: 32.2 % — ABNORMAL LOW (ref 35.0–47.0)
HGB: 10.5 g/dL — AB (ref 12.0–16.0)
Lymphocyte #: 0.5 x10 3/mm — ABNORMAL LOW (ref 1.0–3.6)
Lymphocyte %: 10.4 %
MCH: 30.3 pg (ref 26.0–34.0)
MCHC: 32.7 g/dL (ref 32.0–36.0)
MCV: 93 fL (ref 80–100)
Monocyte #: 0.6 x10 3/mm (ref 0.2–0.9)
Monocyte %: 11.5 %
NEUTROS PCT: 75.8 %
Neutrophil #: 4 x10 3/mm (ref 1.4–6.5)
PLATELETS: 131 x10 3/mm — AB (ref 150–440)
RBC: 3.47 10*6/uL — AB (ref 3.80–5.20)
RDW: 15.1 % — ABNORMAL HIGH (ref 11.5–14.5)
WBC: 5.2 x10 3/mm (ref 3.6–11.0)

## 2014-02-15 LAB — CBC CANCER CENTER
BASOS PCT: 0.7 %
Basophil #: 0 x10 3/mm (ref 0.0–0.1)
EOS PCT: 3.6 %
Eosinophil #: 0.2 x10 3/mm (ref 0.0–0.7)
HCT: 35.6 % (ref 35.0–47.0)
HGB: 11.6 g/dL — ABNORMAL LOW (ref 12.0–16.0)
Lymphocyte #: 0.7 x10 3/mm — ABNORMAL LOW (ref 1.0–3.6)
Lymphocyte %: 13 %
MCH: 30.2 pg (ref 26.0–34.0)
MCHC: 32.7 g/dL (ref 32.0–36.0)
MCV: 92 fL (ref 80–100)
MONO ABS: 0.6 x10 3/mm (ref 0.2–0.9)
Monocyte %: 10.9 %
Neutrophil #: 4 x10 3/mm (ref 1.4–6.5)
Neutrophil %: 71.8 %
Platelet: 196 x10 3/mm (ref 150–440)
RBC: 3.85 10*6/uL (ref 3.80–5.20)
RDW: 15.4 % — ABNORMAL HIGH (ref 11.5–14.5)
WBC: 5.6 x10 3/mm (ref 3.6–11.0)

## 2014-02-15 LAB — CREATININE, SERUM
Creatinine: 1.43 mg/dL — ABNORMAL HIGH (ref 0.60–1.30)
EGFR (African American): 39 — ABNORMAL LOW
GFR CALC NON AF AMER: 33 — AB

## 2014-02-15 LAB — HEPATIC FUNCTION PANEL A (ARMC)
ALBUMIN: 3.5 g/dL (ref 3.4–5.0)
ALK PHOS: 69 U/L
BILIRUBIN DIRECT: 0.1 mg/dL (ref 0.00–0.20)
Bilirubin,Total: 0.5 mg/dL (ref 0.2–1.0)
SGOT(AST): 36 U/L (ref 15–37)
SGPT (ALT): 47 U/L
Total Protein: 6.7 g/dL (ref 6.4–8.2)

## 2014-02-24 ENCOUNTER — Ambulatory Visit: Payer: Self-pay | Admitting: Internal Medicine

## 2014-02-24 ENCOUNTER — Ambulatory Visit: Payer: Self-pay | Admitting: Radiation Oncology

## 2014-05-04 ENCOUNTER — Emergency Department: Payer: Self-pay | Admitting: Emergency Medicine

## 2014-05-04 LAB — URINALYSIS, COMPLETE
BACTERIA: NONE SEEN
Bilirubin,UR: NEGATIVE
Blood: NEGATIVE
Glucose,UR: NEGATIVE mg/dL (ref 0–75)
Hyaline Cast: 5
KETONE: NEGATIVE
Leukocyte Esterase: NEGATIVE
NITRITE: NEGATIVE
Ph: 5 (ref 4.5–8.0)
Protein: NEGATIVE
Specific Gravity: 1.006 (ref 1.003–1.030)
Squamous Epithelial: <1
WBC UR: <1 /HPF (ref 0–5)

## 2014-05-04 LAB — CBC
HCT: 34.5 % — AB (ref 35.0–47.0)
HGB: 11.3 g/dL — AB (ref 12.0–16.0)
MCH: 31.6 pg (ref 26.0–34.0)
MCHC: 32.8 g/dL (ref 32.0–36.0)
MCV: 96 fL (ref 80–100)
Platelet: 165 10*3/uL (ref 150–440)
RBC: 3.58 10*6/uL — AB (ref 3.80–5.20)
RDW: 14.4 % (ref 11.5–14.5)
WBC: 8.1 10*3/uL (ref 3.6–11.0)

## 2014-05-04 LAB — BASIC METABOLIC PANEL
ANION GAP: 8 (ref 7–16)
BUN: 46 mg/dL — ABNORMAL HIGH (ref 7–18)
CO2: 32 mmol/L (ref 21–32)
CREATININE: 1.77 mg/dL — AB (ref 0.60–1.30)
Calcium, Total: 10 mg/dL (ref 8.5–10.1)
Chloride: 101 mmol/L (ref 98–107)
EGFR (African American): 35 — ABNORMAL LOW
GFR CALC NON AF AMER: 29 — AB
Glucose: 102 mg/dL — ABNORMAL HIGH (ref 65–99)
Osmolality: 293 (ref 275–301)
Potassium: 3.9 mmol/L (ref 3.5–5.1)
Sodium: 141 mmol/L (ref 136–145)

## 2014-05-14 ENCOUNTER — Emergency Department: Payer: Self-pay | Admitting: Emergency Medicine

## 2014-05-14 LAB — URINALYSIS, COMPLETE
Bilirubin,UR: NEGATIVE
GLUCOSE, UR: NEGATIVE mg/dL (ref 0–75)
Ketone: NEGATIVE
NITRITE: NEGATIVE
PH: 6 (ref 4.5–8.0)
Protein: NEGATIVE
Renal Epithelial: <1
SPECIFIC GRAVITY: 1.009 (ref 1.003–1.030)
Squamous Epithelial: 11
WBC UR: 4 /HPF (ref 0–5)

## 2014-05-14 LAB — CBC WITH DIFFERENTIAL/PLATELET
BASOS ABS: 0.1 10*3/uL (ref 0.0–0.1)
Basophil %: 0.7 %
EOS ABS: 0.2 10*3/uL (ref 0.0–0.7)
Eosinophil %: 2.3 %
HCT: 26.7 % — AB (ref 35.0–47.0)
HGB: 8.7 g/dL — AB (ref 12.0–16.0)
LYMPHS PCT: 5.4 %
Lymphocyte #: 0.5 10*3/uL — ABNORMAL LOW (ref 1.0–3.6)
MCH: 31.6 pg (ref 26.0–34.0)
MCHC: 32.4 g/dL (ref 32.0–36.0)
MCV: 98 fL (ref 80–100)
Monocyte #: 0.6 x10 3/mm (ref 0.2–0.9)
Monocyte %: 7.5 %
NEUTROS PCT: 84.1 %
Neutrophil #: 7 10*3/uL — ABNORMAL HIGH (ref 1.4–6.5)
Platelet: 146 10*3/uL — ABNORMAL LOW (ref 150–440)
RBC: 2.74 10*6/uL — ABNORMAL LOW (ref 3.80–5.20)
RDW: 14.5 % (ref 11.5–14.5)
WBC: 8.4 10*3/uL (ref 3.6–11.0)

## 2014-05-14 LAB — BASIC METABOLIC PANEL
Anion Gap: 8 (ref 7–16)
BUN: 43 mg/dL — AB (ref 7–18)
CALCIUM: 8.1 mg/dL — AB (ref 8.5–10.1)
CHLORIDE: 105 mmol/L (ref 98–107)
CREATININE: 1.86 mg/dL — AB (ref 0.60–1.30)
Co2: 29 mmol/L (ref 21–32)
EGFR (African American): 33 — ABNORMAL LOW
EGFR (Non-African Amer.): 27 — ABNORMAL LOW
Glucose: 176 mg/dL — ABNORMAL HIGH (ref 65–99)
OSMOLALITY: 298 (ref 275–301)
Potassium: 4.2 mmol/L (ref 3.5–5.1)
Sodium: 142 mmol/L (ref 136–145)

## 2014-05-14 LAB — TROPONIN I: TROPONIN-I: 0.03 ng/mL

## 2014-05-15 ENCOUNTER — Inpatient Hospital Stay: Payer: Self-pay | Admitting: Internal Medicine

## 2014-05-15 LAB — APTT: Activated PTT: 24.5 secs (ref 23.6–35.9)

## 2014-05-15 LAB — CBC WITH DIFFERENTIAL/PLATELET
BASOS ABS: 0.1 10*3/uL (ref 0.0–0.1)
Basophil %: 0.5 %
EOS ABS: 0 10*3/uL (ref 0.0–0.7)
Eosinophil %: 0.2 %
HCT: 20.1 % — AB (ref 35.0–47.0)
HGB: 6.4 g/dL — AB (ref 12.0–16.0)
LYMPHS PCT: 5 %
Lymphocyte #: 0.6 10*3/uL — ABNORMAL LOW (ref 1.0–3.6)
MCH: 31 pg (ref 26.0–34.0)
MCHC: 32.1 g/dL (ref 32.0–36.0)
MCV: 97 fL (ref 80–100)
MONO ABS: 0.7 x10 3/mm (ref 0.2–0.9)
Monocyte %: 5.7 %
NEUTROS PCT: 88.6 %
Neutrophil #: 10.3 10*3/uL — ABNORMAL HIGH (ref 1.4–6.5)
Platelet: 123 10*3/uL — ABNORMAL LOW (ref 150–440)
RBC: 2.08 10*6/uL — ABNORMAL LOW (ref 3.80–5.20)
RDW: 14.3 % (ref 11.5–14.5)
WBC: 11.6 10*3/uL — AB (ref 3.6–11.0)

## 2014-05-15 LAB — PROTIME-INR
INR: 1.1
PROTHROMBIN TIME: 13.6 s (ref 11.5–14.7)

## 2014-05-15 LAB — BASIC METABOLIC PANEL
Anion Gap: 10 (ref 7–16)
BUN: 56 mg/dL — ABNORMAL HIGH (ref 7–18)
CALCIUM: 8.2 mg/dL — AB (ref 8.5–10.1)
CO2: 27 mmol/L (ref 21–32)
CREATININE: 2.26 mg/dL — AB (ref 0.60–1.30)
Chloride: 106 mmol/L (ref 98–107)
EGFR (Non-African Amer.): 22 — ABNORMAL LOW
GFR CALC AF AMER: 26 — AB
Glucose: 134 mg/dL — ABNORMAL HIGH (ref 65–99)
OSMOLALITY: 302 (ref 275–301)
Potassium: 4.7 mmol/L (ref 3.5–5.1)
SODIUM: 143 mmol/L (ref 136–145)

## 2014-05-16 LAB — BASIC METABOLIC PANEL
Anion Gap: 8 (ref 7–16)
BUN: 57 mg/dL — ABNORMAL HIGH (ref 7–18)
Calcium, Total: 7.3 mg/dL — ABNORMAL LOW (ref 8.5–10.1)
Chloride: 111 mmol/L — ABNORMAL HIGH (ref 98–107)
Co2: 26 mmol/L (ref 21–32)
Creatinine: 2.37 mg/dL — ABNORMAL HIGH (ref 0.60–1.30)
GFR CALC AF AMER: 25 — AB
GFR CALC NON AF AMER: 21 — AB
GLUCOSE: 112 mg/dL — AB (ref 65–99)
OSMOLALITY: 305 (ref 275–301)
POTASSIUM: 3.5 mmol/L (ref 3.5–5.1)
SODIUM: 145 mmol/L (ref 136–145)

## 2014-05-16 LAB — CBC WITH DIFFERENTIAL/PLATELET
Basophil #: 0.1 10*3/uL (ref 0.0–0.1)
Basophil %: 0.5 %
Eosinophil #: 0.1 10*3/uL (ref 0.0–0.7)
Eosinophil %: 0.5 %
HCT: 23.6 % — AB (ref 35.0–47.0)
HGB: 7.7 g/dL — ABNORMAL LOW (ref 12.0–16.0)
LYMPHS ABS: 0.8 10*3/uL — AB (ref 1.0–3.6)
Lymphocyte %: 7.1 %
MCH: 30.4 pg (ref 26.0–34.0)
MCHC: 32.9 g/dL (ref 32.0–36.0)
MCV: 93 fL (ref 80–100)
MONO ABS: 1.2 x10 3/mm — AB (ref 0.2–0.9)
MONOS PCT: 10.3 %
Neutrophil #: 9.8 10*3/uL — ABNORMAL HIGH (ref 1.4–6.5)
Neutrophil %: 81.6 %
PLATELETS: 89 10*3/uL — AB (ref 150–440)
RBC: 2.55 10*6/uL — AB (ref 3.80–5.20)
RDW: 15.4 % — AB (ref 11.5–14.5)
WBC: 12 10*3/uL — ABNORMAL HIGH (ref 3.6–11.0)

## 2014-05-17 LAB — CBC WITH DIFFERENTIAL/PLATELET
Basophil #: 0 10*3/uL (ref 0.0–0.1)
Basophil %: 0.4 %
Eosinophil #: 0.1 10*3/uL (ref 0.0–0.7)
Eosinophil %: 1.3 %
HCT: 25 % — ABNORMAL LOW (ref 35.0–47.0)
HGB: 8.4 g/dL — ABNORMAL LOW (ref 12.0–16.0)
Lymphocyte #: 0.7 10*3/uL — ABNORMAL LOW (ref 1.0–3.6)
Lymphocyte %: 7.1 %
MCH: 31.4 pg (ref 26.0–34.0)
MCHC: 33.5 g/dL (ref 32.0–36.0)
MCV: 94 fL (ref 80–100)
Monocyte #: 0.9 x10 3/mm (ref 0.2–0.9)
Monocyte %: 9.3 %
NEUTROS PCT: 81.9 %
Neutrophil #: 7.8 10*3/uL — ABNORMAL HIGH (ref 1.4–6.5)
Platelet: 89 10*3/uL — ABNORMAL LOW (ref 150–440)
RBC: 2.66 10*6/uL — ABNORMAL LOW (ref 3.80–5.20)
RDW: 15.6 % — AB (ref 11.5–14.5)
WBC: 9.5 10*3/uL (ref 3.6–11.0)

## 2014-05-17 LAB — COMPREHENSIVE METABOLIC PANEL
ALBUMIN: 2.7 g/dL — AB (ref 3.4–5.0)
ANION GAP: 6 — AB (ref 7–16)
Alkaline Phosphatase: 70 U/L
BUN: 45 mg/dL — ABNORMAL HIGH (ref 7–18)
Bilirubin,Total: 0.8 mg/dL (ref 0.2–1.0)
CALCIUM: 7.9 mg/dL — AB (ref 8.5–10.1)
Chloride: 114 mmol/L — ABNORMAL HIGH (ref 98–107)
Co2: 27 mmol/L (ref 21–32)
Creatinine: 1.81 mg/dL — ABNORMAL HIGH (ref 0.60–1.30)
GFR CALC AF AMER: 34 — AB
GFR CALC NON AF AMER: 28 — AB
Glucose: 107 mg/dL — ABNORMAL HIGH (ref 65–99)
OSMOLALITY: 304 (ref 275–301)
POTASSIUM: 3.5 mmol/L (ref 3.5–5.1)
SGOT(AST): 50 U/L — ABNORMAL HIGH (ref 15–37)
SGPT (ALT): 32 U/L
Sodium: 147 mmol/L — ABNORMAL HIGH (ref 136–145)
TOTAL PROTEIN: 6 g/dL — AB (ref 6.4–8.2)

## 2014-05-18 LAB — CBC WITH DIFFERENTIAL/PLATELET
Basophil #: 0 10*3/uL (ref 0.0–0.1)
Basophil %: 0.4 %
Eosinophil #: 0.1 10*3/uL (ref 0.0–0.7)
Eosinophil %: 1.2 %
HCT: 25.1 % — ABNORMAL LOW (ref 35.0–47.0)
HGB: 8.4 g/dL — ABNORMAL LOW (ref 12.0–16.0)
LYMPHS PCT: 6.4 %
Lymphocyte #: 0.5 10*3/uL — ABNORMAL LOW (ref 1.0–3.6)
MCH: 31.7 pg (ref 26.0–34.0)
MCHC: 33.6 g/dL (ref 32.0–36.0)
MCV: 95 fL (ref 80–100)
MONO ABS: 0.8 x10 3/mm (ref 0.2–0.9)
MONOS PCT: 9.2 %
NEUTROS ABS: 7 10*3/uL — AB (ref 1.4–6.5)
Neutrophil %: 82.8 %
Platelet: 96 10*3/uL — ABNORMAL LOW (ref 150–440)
RBC: 2.65 10*6/uL — AB (ref 3.80–5.20)
RDW: 15.1 % — AB (ref 11.5–14.5)
WBC: 8.5 10*3/uL (ref 3.6–11.0)

## 2014-05-18 LAB — BASIC METABOLIC PANEL
Anion Gap: 6 — ABNORMAL LOW (ref 7–16)
BUN: 29 mg/dL — ABNORMAL HIGH (ref 7–18)
Calcium, Total: 8.1 mg/dL — ABNORMAL LOW (ref 8.5–10.1)
Chloride: 112 mmol/L — ABNORMAL HIGH (ref 98–107)
Co2: 26 mmol/L (ref 21–32)
Creatinine: 1.41 mg/dL — ABNORMAL HIGH (ref 0.60–1.30)
EGFR (Non-African Amer.): 38 — ABNORMAL LOW
GFR CALC AF AMER: 46 — AB
Glucose: 112 mg/dL — ABNORMAL HIGH (ref 65–99)
OSMOLALITY: 293 (ref 275–301)
Potassium: 3.3 mmol/L — ABNORMAL LOW (ref 3.5–5.1)
Sodium: 144 mmol/L (ref 136–145)

## 2014-07-22 ENCOUNTER — Ambulatory Visit: Payer: Self-pay | Admitting: Internal Medicine

## 2014-07-26 ENCOUNTER — Ambulatory Visit
Admit: 2014-07-26 | Disposition: A | Discharge status: Home / Self Care | Payer: Self-pay | Attending: Internal Medicine | Admitting: Internal Medicine

## 2014-08-02 IMAGING — CR DG CHEST 1V PORT
1 series · 1 of 1 positions shown · non-contrast
Comparison: 08/16/2006

CLINICAL DATA: Weakness and chest pain

EXAM:
PORTABLE CHEST - 1 VIEW

[AP]
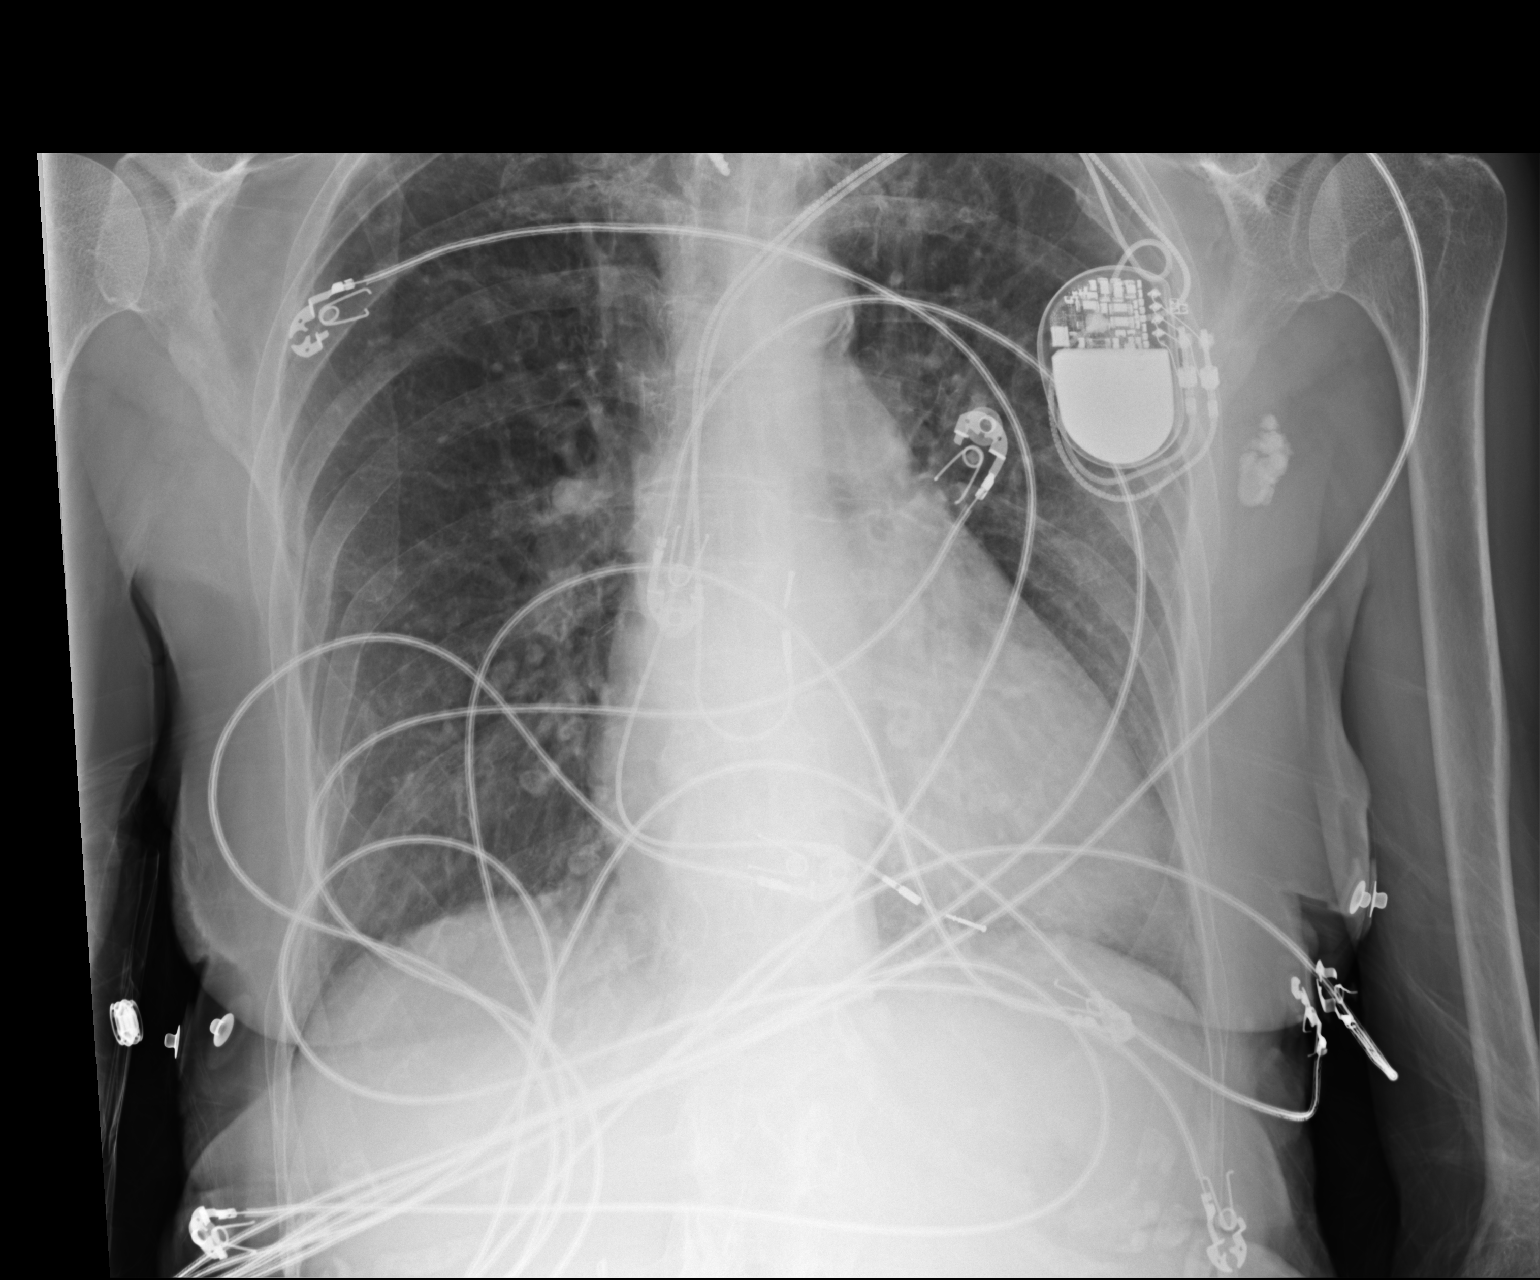

[1 of 1 positions shown; findings below may reference images not displayed]

FINDINGS: The cardiac shadow is stable. A pacing device is now seen.
Postsurgical changes are noted in the lower cervical and upper
thoracic spine. The lungs are well aerated bilaterally with mild
interstitial changes. No focal infiltrate or sizable effusion is
seen. Calcified lymph node is noted in the left axilla and stable.
Old rib fractures are noted on the right.
IMPRESSION: Chronic changes without acute abnormality.

## 2014-08-26 ENCOUNTER — Ambulatory Visit
Admit: 2014-08-26 | Disposition: A | Discharge status: Home / Self Care | Payer: Self-pay | Attending: Internal Medicine | Admitting: Internal Medicine

## 2014-09-13 NOTE — H&P (Signed)
PATIENT NAME:  Brittany Casey, Brittany Casey MR#:  [id number] DATE OF BIRTH:  [birth year]  DATE OF ADMISSION:  12/25/2011  REFERRING PHYSICIAN: Ponciano Ort, MD  PRIMARY CARE PHYSICIAN: Lisette Grinder  III, MD  CARDIOLOGIST:  Bartholome Bill, MD  CHIEF COMPLAINT: Shortness of breath.   HISTORY OF PRESENT ILLNESS: The patient is an 81 year old Caucasian female who has history of hypertension, osteoarthritis, and history of diastolic congestive heart failure requiring hospitalization on 10/02/2011 and has been short of breath for the past few days. She was seen by Dr. Gilford Rile and received a Lasix "shot", according to her husband, last Monday. She continued to not improve. Her shortness of breath has progressively gotten worse. So she is brought to the ED. The patient also has feeling of chest pressure which she feels is related to her shortness of breath. She does not have any chest pain. She does not have any lower extremity swelling. She props herself up to sleep. She has not been having any fevers or coughing. She complains of dyspnea on exertion as well as nocturnal dyspnea. No fevers or chills, just feels very cold right now. She denies any abdominal pain, nausea, vomiting, or diarrhea.   PAST MEDICAL HISTORY:  1. Hypertension.  2. Osteoarthritis.  3. Chronic depression.  4. History of atrial fibrillation.  5. Sick sinus syndrome status post pacemaker placement.  6. History of congestive heart failure, diastolic in nature according to the last note. 7. History of gastritis.  8. History of subdural hematoma requiring evacuation. This was related to Coumadin and fall.  9. History of cerebrovascular accident.  10. Chronic renal insufficiency/failure, creatinine is usually around 1.3 to 1.4 with decreased GFR.   PAST SURGICAL HISTORY:  1. Status post cholecystectomy.  2. Status post cervical fusion for spinal stenosis, diskectomy, and anterior decompression.  3. Status post hemorrhoid surgery.   ALLERGIES:  Coumadin - According to her husband, this is not a real allergy, but has been told never to use any anticoagulants due to cerebral hemorrhage; etodolac, oxycodone, Prilosec, shellfish, adhesive tape, and Prilosec.   CURRENT HOME MEDICATIONS:  1. Advair 100/50 INH twice a day. 2. Aspirin 160 mg two daily.  3. Caltrate +600 vitamin D 1 tab p.o. daily.  4. Celebrex 200 mg daily.  5. Centrum Silver 1 tab p.o. daily.  6. Diltiazem 180 mg 1 tab p.o. daily.  7. FiberCon 625 mg 1 tab p.o. daily.  8. Hydrochlorothiazide 25 mg p.o. daily.  9. KCL 20 mEq 1 tab p.o. twice a day. 10. Lasix 20 mg once at bedtime and then 40 mg in the morning. 11. Letairis 5 mg daily. 12. Metoprolol tartrate 100 mg 1 tab p.o. twice a day. 13. Sertraline 50 mg 1 tab p.o. at bedtime.  14. Triamcinolone 0.1% topical cream apply to affected area as needed.   SOCIAL HISTORY: She lives with her husband. Denies tobacco, alcohol or drug use.   REVIEW OF SYSTEMS: CONSTITUTIONAL: Denies any fevers. Some fatigue and weakness. There has been some weight gain, not sure exactly how much. EYES: No double vision. No blurred vision. No redness. ENT: No tinnitus. No snoring. She has chronic hearing difficulties. No difficulty swallowing. RESPIRATORY: No cough. No wheezing. No hemoptysis. There is a history of chronic obstructive pulmonary disease, history of pulmonary hypertension, and chronic shortness of breath. CARDIOVASCULAR: Complains of chest pressure. Complains of shortness of breath. Has a history of atrial fibrillation and sick sinus syndrome status post pacemaker placement. GASTROINTESTINAL: Denies any abdominal pain, nausea,  vomiting, or diarrhea. No bright red blood per rectum. GENITOURINARY: Denies any dysuria, hematuria, frequency, or urgency. ENDOCRINE: Denies any polydipsia, nocturia, or thyroid problems. HEME/LYMPH: Denies any anemia or easy bruisability. SKIN: Denies any rashes. MUSCULOSKELETAL: Has chronic arthritis.  NEUROLOGIC: Has previous history of hemorrhagic stroke and subdural hematoma. PSYCHIATRIC: Does have depression.   PHYSICAL EXAMINATION:   VITAL SIGNS: Temperature 97.9, pulse 70, respirations 16, blood pressure 124/58, and O2 97% on 2 liters.   GENERAL: The patient is a debilitated elderly-appearing female.  HEENT: Head atraumatic, normocephalic. Pupils equally round and reactive to light and accommodation. There is no conjunctival pallor. No scleral icterus. Nasal exam shows no drainage or ulceration. Oropharynx is clear without any exudates.   NECK: No thyromegaly. No carotid bruits.   CARDIOVASCULAR: Irregularly irregular rhythm. No rubs, clicks, or gallops. PMI is not displaced. Systolic murmur heard at the left apex.  LUNGS: Bilateral crackles at the bases.   ABDOMEN: Soft, nontender, and nondistended. Positive bowel sounds x4.  EXTREMITIES: No clubbing, cyanosis, or edema.   SKIN: No rash.   LYMPHATICS: No lymph nodes palpable.   VASCULAR: Good DP and PT pulses.   PSYCHIATRIC: Not anxious or depressed.   NEUROLOGICAL: Awake, alert, and oriented x3. Cranial nerves II through XII grossly intact. No focal deficits.   EVALUATIONS IN THE ED: BNP 3784. Glucose 89, BUN 33, creatinine 1.37, sodium 141, potassium 3.6, chloride 106, CO2 31, and calcium 8.9. LFTs are normal, except AST of 48. CPK is 174. CK-MB is 2.0. Troponin is less than 0.02. WBC 6.3, hemoglobin 10.4, and platelet count 215.   ASSESSMENT AND PLAN: The patient is an 81 year old with history of diastolic congestive heart failure, severe mitral regurgitation, and severe left atrial enlargement who presents with shortness of breath and chest pressure.  1. Acute on chronic diastolic congestive heart failure. At this time, we will treat her with IV Lasix and continue metoprolol as taking at home. We will place her on telemetry. We will have cardiology evaluate the patient. 2. Chest pressure, likely due to congestive heart  failure. We will check serial cardiac enzymes and  have her cardiologist evaluate the patient.  3. Hypertension. Continue metoprolol and hold HCTZ due to the patient being diuresed with Lasix.  4. Atrial fibrillation. We will continue aspirin, metoprolol, and Cardizem as taking at home, as well as amiodarone.  5. Miscellaneous. The patient will be on aspirin for deep vein thrombosis prophylaxis.   TIME SPENT: 45 minutes. ____________________________ Lafonda Mosses Posey Pronto, MD shp:slb D: 12/25/2011 21:28:13 ET T: 12/26/2011 09:44:08 ET JOB#: [id number]  cc: India Jolin H. Posey Pronto, MD, <Dictator> John B. Sarina Ser, MD Alric Seton MD ELECTRONICALLY SIGNED 01/02/2012 13:39

## 2014-09-13 NOTE — Consult Note (Signed)
General Aspect patient is an 81 year old female with history of severe pulmonary hypertension with preserved left ventricular function who was admitted with progressive shortness of breath and felt to have congestive heart failure. Patient underwent a right heart catheterization which revealed pulmonary artery pressure of 66/34 with a mean of 48. Her pulmonary capillary wedge pressure  was 12.  she'll set of sick sinus syndrome status post permanent pacemaker. She has chronic atrial fibrillation, coronary artery disease and COPD. She had a history of a hemorrhagic CVA when on Coumadin therefore is not on anticoagulation. She is ruled out for myocardial infarction and has improved with diuresis. Patient had been treated as an outpatient but did not improve and was sent to the emergency room for admission. She is being seen by pulmonary with regard to her pulmonary hypertension. She currently denies chest pain. She states she eats a low-sodium diet.   Physical Exam:   GEN thin    HEENT PERRL, poor dentition, hard of hearing with hearing aids    NECK supple    RESP normal resp effort  rhonchi  crackles    CARD Irregular rate and rhythm  Murmur    Murmur Systolic    Systolic Murmur axilla    ABD denies tenderness  normal BS  no Adominal Mass    LYMPH negative neck    EXTR negative cyanosis/clubbing, positive edema    SKIN normal to palpation    NEURO cranial nerves intact, motor/sensory function intact    PSYCH A+O to time, place, person   Review of Systems:   Subjective/Chief Complaint shortness of breath    General: Fatigue  Weakness    Skin: Color changes    ENT: No Complaints    Eyes: No Complaints    Neck: No Complaints    Respiratory: Short of breath    Cardiovascular: Dyspnea    Gastrointestinal: No Complaints    Genitourinary: No Complaints    Vascular: No Complaints    Musculoskeletal: No Complaints    Neurologic: No Complaints    Hematologic: No  Complaints    Endocrine: No Complaints    Psychiatric: No Complaints    Review of Systems: All other systems were reviewed and found to be negative    Medications/Allergies Reviewed Medications/Allergies reviewed     Macular Degeneration: NO BLOOD THINNERS, unable to take this type of medication d/t subdural hematoma per her neurologist and family insistance.   Dysphagia: takes meds w/ puree (applesauce)   Emphysema:    HTN:    reflux:    CHF:    COPD:    atrial fib:    depression:    Osteoarthritis:    hypertension:    emphysema:    Pacemaker:    Myocardial Infarct:    Atrial Fibrillation:    CVA/Stroke:    Hard of Hearing:    Removal of 2nd right toe:    Cholecystectomy:    SUBDURAL HEMATOMA:    Hemorrhoidectomy:    amputation left second toe:    Brain Surgery:   Home Medications: Medication Instructions Status  triamcinolone 0.1% topical cream Apply to affected area as needed Active  metoprolol tartrate 100 mg oral tablet 1 tab(s) orally 2 times a day Active  Lasix 40 mg oral tablet 40 milligram(s) orally  Active  Lasix 20 mg oral tablet 20 milligram(s) orally once a day (at bedtime) Active  hydrochlorothiazide 25 mg oral tablet 1 tab(s) orally once a day Active  KCl-20 20 milliequivalent(s) orally  2 times a day Active  Centrum Silver oral tablet 1 tab(s) orally once a day Active  Caltrate 600 with D 1 tab(s) orally once a day Active  aspirin 81 mg oral tablet 2 tabs (162mg ) orally once a day. Active  Celebrex 200 mg oral capsule 1 cap(s) orally once a day Active  FiberCon 625 mg oral tablet 1 tab(s) orally once a day Active  Advair Diskus 100 mcg-50 mcg inhalation powder 1 puff(s) inhaled every 12 hours Active  sertraline 50 mg oral tablet 1 tab(s) orally once a day (at bedtime) Active  diltiazem 180 mg/24 hours oral tablet, extended release 1 tab(s) orally once a day Active  Letairis 5 mg oral tablet 1 tab(s) orally once a day Active    EKG:   Abnormal NSSTTW changes    Interpretation atrial fibrillation with variable ventricular response    Prilosec: Itching, Rash  Shellfish: Swelling  Adhesive: Rash  Lovenox: Other  Pradaxa: Other  Etodolac: Unknown  Oxycodone: Unknown  Heparin: Do NOT Use  Coumadin: Do NOT Use    Impression 81 year old female with history of chronic atrial fibrillation treated with rate control and not anticoagulated secondary to history of intercerebral bleed, history of severe pulmonary hypertension with right heart catheter documenting a mean pulmonary artery pressure of 48 with a PA systolic pressure of 66. Her Wedge pressure was normal. She was admitted with progressive shortness of breath after an attempt at diuresis with oral medications as an outpatient failed. She is improved with diuresis and oxygen as an inpatient. She is able to ambulate with some shortness of breath but has improved to near her baseline.    Plan 1. Continue with careful diuresis following renal function 2. Continue to control her atrial fibrillation with current medications 3. Is not a candidate for chronic anticoagulation secondary to her hemorrhagic CVA which occurred on Coumadin 4. Low-sodium diet 5. Ambulate in the morning and consider discharge if stable. 6. We'll follow as an outpatient   Electronic Signatures: Teodoro Spray (MD)  (Signed 01-Aug-13 20:48)  Authored: General Aspect/Present Illness, History and Physical Exam, Review of System, Past Medical History, Home Medications, EKG , Allergies, Impression/Plan   Last Updated: 01-Aug-13 20:48 by Teodoro Spray (MD)

## 2014-09-13 NOTE — Discharge Summary (Signed)
PATIENT NAME:  Brittany Casey, MCNEW MR#:  [id number] DATE OF BIRTH:  Dec 26, 1926  DATE OF ADMISSION:  12/25/2011 DATE OF DISCHARGE:  12/28/2011  HISTORY OF PRESENT ILLNESS: Brittany Casey was an 81 year old white married female who presented to the Emergency Room with shortness of breath. The patient had been treated as an outpatient for increasing symptoms of congestive heart failure. She received Lasix IM in the office plus her oral had been increased to 40 mg b.i.d. Despite this she continued to deteriorate and went to the Emergency Room where she was found to be hypoxic even on her supplemental oxygen.   PAST MEDICAL HISTORY:  1. Hypertension.  2. Osteoarthritis.  3. Chronic depression.  4. Atrial fibrillation.  5. Sick sinus syndrome with a previous pacemaker placement. 6. History of gastritis. 7. History of a previous subdural hematoma.  8. History of previous cerebrovascular accident. 9. Chronic renal insufficiency. 10. Pulmonary hypertension.   PAST SURGICAL HISTORY:  1. Previous cholecystectomy.  2. She also had a previous cervical fusion for a spinal stenosis.  3. Previous surgery for subdural hematoma.   MEDICATIONS ON ADMISSION:  1. Advair 100/50, 1 puff b.i.d.  2. Aspirin 160 mg 2 tablets daily.  3. Celebrex 200 mg daily.  4. Diltiazem 180 mg daily. 5. Hydrochlorothiazide 25 mg daily.  6. Potassium chloride 20 mEq b.i.d.  7. Lasix 40 mg in the morning and 20 mg at bedtime which had recently been increased to 40 mg b.i.d.  8. Metoprolol 100 mg b.i.d.  9. Zoloft 50 mg at bedtime.  10. Letairis 5 mg daily for pulmonary hypertension.    PHYSICAL EXAMINATION: Admission physical examination as described by the admitting physician revealed temperature 97.9, pulse 70, respirations 16, blood pressure 124/58. Exam in general as described by the admitting physician was that of a debilitated, elderly appearing lady. She was noted to have an irregular rhythm with good rate control. Auscultation  of the chest revealed bilateral crackles in the bases. No rales were described. No peripheral edema was described.   LABORATORY, DIAGNOSTIC AND RADIOLOGIC DATA: Admission CBC showed a hemoglobin of 10.4 with a hematocrit of 30.1. White count was 6300. Platelet count was 215,000. Admission BNP was 3784. Admission comprehensive metabolic panel was notable for a BUN of 33 with a creatinine of 1.37. SGOT was 48. Estimated GFR was 36. Serial cardiac enzymes drawn on admission were unremarkable. Admission urinalysis was within normal limits. Admission electrocardiogram showed a paced rhythm. Admission chest x-ray showed congestive heart failure with bilateral pleural effusions.   HOSPITAL COURSE: The patient was admitted to the regular medical floor where she was placed on telemetry. She was treated with IV Lasix for her congestive heart failure. Although she had a good urinary output she only had partial clearing of her chest x-ray. She also could not be weaned completely off of supplemental O2. Prior to admission she had just been on oxygen at bedtime. The patient, however, felt better clinically and was ambulating without difficulty. She was eventually bridged to Lasix 40 mg twice a day. She was continued on supplemental oxygen by nasal cannula at 2 L/min.   DISCHARGE DIAGNOSES:  1. Acute on chronic congestive heart failure.  2. Pulmonary hypertension.  3. Sick sinus syndrome.  4. History of cerebrovascular accident.   DISCHARGE MEDICATIONS:  1. Metoprolol 100 mg twice a day.  2. Hydrochlorothiazide 25 mg daily.  3. Potassium chloride 20 mEq twice a day.  4. Aspirin 81 mg daily.  5. Celebrex 200 mg  daily.  6. Advair 100/50, 1 puff b.i.d.  7. Zoloft 50 mg at bedtime.  8. Diltiazem 180 mg daily extended release.  9. Letairis 5 mg daily.  10. Lasix 40 mg twice a day.   DISCHARGE DISPOSITION: The patient was discharged on a 2 gram sodium diet with activity as tolerated. She will have continuous  oxygen at 2 L/ minute.  FOLLOW UP: The patient will be followed up in the office in approximately 1 to 2 weeks.   ____________________________ Hewitt Blade Sarina Ser, MD jbw:cms D: 12/29/2011 15:03:20 ET T: 12/30/2011 10:46:15 ET JOB#: [id number]  cc: [name] B. Sarina Ser, MD, <Dictator> Lottie Mussel III MD ELECTRONICALLY SIGNED 01/05/2012 20:45

## 2014-09-16 NOTE — H&P (Signed)
PATIENT NAME:  Brittany Casey, Brittany Casey MR#:  [id number] DATE OF BIRTH:  Sep 04, 1926  DATE OF ADMISSION:  01/23/2013  DATE OF ADMISSION: 01/23/2013   PRIMARY CARE PHYSICIAN: Dr. Lisette Grinder.   PRIMARY CARDIOLOGIST: Dr. Bartholome Bill.   REFERRING EMERGENCY ROOM PHYSICIAN: Dr. Conni Slipper.    CHIEF COMPLAINT: Shortness of breath.  HISTORY OF PRESENT ILLNESS: The patient is an 81 year old Caucasian female with a history of hypertension, osteoarthritis, diastolic congestive heart failure, right-sided elevated pressure in the heart, chronic depression who was in her usual state of health 4 to 5 days ago. For the last 3 to 4 days she started gaining weight and feeling a little short of breath. She gained almost 5 pounds' weight in the last 4 to 5 days, and as per the instruction from her primary care physician she increased her dose of Lasix. Usually she takes a 40 mg tablet 2 times a day. Instead of that she started taking 40 mg 3 times a day, but still her weight did not come down and her shortness of breath remains the same. She also started having tightness in the chest which was left-sided and also going to her to jaw, but constant, 6 to 7/10, and so decided to come to the Emergency Room. Denies any fever, coughs, palpitations, nausea or vomiting.   REVIEW OF SYSTEMS:  CONSTITUTIONAL: Negative for fever, fatigue, weight loss or weight gain.  EYES: No blurring, double vision, or discharge from the eyes.  EARS: No tinnitus or ringing in the ears.  RESPIRATORY: No cough, wheezing, but has shortness of breath.  CARDIOVASCULAR: Has chest pain, but no palpitations, dizziness. No vertigo. ABDOMEN: No nausea, vomiting, diarrhea or abdominal pain, leg swelling.  GENITOURINARY: Denies any increased frequency of urination or burning in the urine. HEMATOLOGICAL: Denies any bleeding or rashes.  NEUROLOGICAL: Denies any numbness, weakness or tremors.  PSYCHIATRIC: Denies insomnia or bipolar disorder.   PAST MEDICAL  HISTORY:  1.  Hypertension.  2.  Osteoarthritis.  3.  Chronic depression.  4.  History of atrial fibrillation.  5.  Sick sinus syndrome, status post pacemaker placement.  6.  History of congestive heart failure, diastolic in nature; elevated right-sided pressure.  7.  History of gastritis.  8.  History of subdural hematoma requiring evacuation.  9.  History of cerebrovascular accident.  10.  Chronic renal insufficiency   PAST SURGICAL HISTORY:  1.  Status post cholecystectomy.  2.  Status post so cervical fusion for spinal stenosis and anterior decompression.  3.  Status post hemorrhoid surgery.   SOCIAL HISTORY: Lives with husband. Denies tobacco, alcohol or drug use.   HOME MEDICATIONS:   1.  Sertraline 50 mg oral tablet once a day.  2.  Potassium chloride 20 mEq 2 times a day.  3.  Metoprolol 100 mg oral tablet 2 times a day.  4.  Hydrochlorothiazide 25 mg oral tablet once a day.  5.  Furosemide 40 mg 2 times a day.  6.  Diltiazem 180 mg extended release once a day.  8.  Celebrex 200 mg once a day.  9.  Aspirin 81 mg, take 2 tablets once a day.  10.  Advair Diskus 1 puff every 12 hours.   PHYSICAL EXAMINATION: VITAL SIGNS: In ER on presentation, temperature 98.4, pulse 73, respirations 20, blood pressure 142/60, pulse ox 93% on room air.  GENERAL: The patient was alert and oriented to time, place and person. She was in slight distress due to respiratory failure.  HEENT: Head and neck atraumatic. Conjunctivae pink. Mucosa moist.  NECK: Supple. No JVD.   RESPIRATORY: Bilateral equal air entry. Crepitation present.  CARDIOVASCULAR: S1, S2 present. Regular. No murmur.  ABDOMEN: Soft, nontender. Bowel sounds present. No organomegaly.  SKIN: No rashes.  LEGS: Mild edema present.  NEUROLOGICAL: Power 4/5 in all 4 limbs. Generalized weakness. No tremors. Follows commands.  PSYCHIATRIC: Does not appear in any acute psychiatric illness at this time.   LAB RESULTS: BNP 3321,  glucose 97, BUN 46, creatinine 1.43, sodium 137, potassium  3.3, chloride 98, CO2 32, calcium 9.1.   Troponin 0.02.   WBC 6.7, hemoglobin 11.8, platelet count 169, MCV 91.   Chest x-ray, portable: Findings suggestive of low-grade CHF. No focal pneumonia.   EKG: Paced rhythm. There are some T wave inversions inferior and lateral leads, which were  not present in previous EKGs.   ASSESSMENT AND PLAN: An 81 year old female who has a past history of hypertension, osteoarthritis, diastolic congestive heart failure, with right-sided heart failure in the past, presented with shortness of breath and weight gain for the last few days.   1.  Acute diastolic congestive heart failure: Will give intravenous Lasix, will do echocardiogram, monitor on telemetry and follow up troponins. Cardiology consult was called in. Spoke to  Dr. Saralyn Pilar on the phone. He will be seeing the patient while in the hospital.  2.  Electrocardiographic  changes: Will follow serial troponins. This might be either due to strain, due to congestive heart failure, and due to pacemaker. Troponin is negative, and spoke to cardiologist. He will see the patient. Meanwhile, will give nitroglycerin patch to patient, and morphine for pain control.  3.  Hypertension: Continue home medication, and intravenous Lasix.  4.  Hypokalemia: Potassium, orally supplemented.  5.  Chronic depression: Sertraline continued.   Total time spent on this admission: Fifty minutes.   CODE STATUS: FULL CODE.   ____________________________ Ceasar Lund Anselm Jungling, MD vgv:dm D: 01/24/2013 07:48:39 ET T: 01/24/2013 12:16:13 ET JOB#: [id number]  cc: Ceasar Lund. Anselm Jungling, MD, <Dictator> Vaughan Basta MD ELECTRONICALLY SIGNED 01/30/2013 15:35

## 2014-09-16 NOTE — Discharge Summary (Signed)
PATIENT NAME:  Brittany Casey, Brittany Casey MR#:  [id number] DATE OF BIRTH:  1926/06/09  DATE OF ADMISSION:  01/23/2013 DATE OF DISCHARGE:  01/24/2013  FINAL DIAGNOSES 1.  Acute on chronic left-sided systolic congestive heart failure.  2.  Atrial fibrillation with pacemaker.  3.  Osteoarthritis.  4.  Depression and anxiety.  5.  Osteoporosis.  6.  Chronic obstructive pulmonary disease.  7.  Irritable bowel syndrome.  8.  Gastroesophageal reflux disease with chronic gastritis.  9.  Pulmonary hypertension.  10.  Degenerative disc disease with spinal stenosis and with prior surgery.   HISTORY AND PHYSICAL: Please see dictated admission history and physical.   HOSPITAL COURSE: The patient was admitted with shortness of breath and chest x-ray  revealed evidence of low-grade pulmonary edema consistent with acute on chronic left-sided systolic congestive heart failure. She was diuresed and did very well with this, weaning to room air. On the day of discharge, she was asking to go home. I ambulated her personally 110 feet on room air and she had no troubles with this. She will be discharged to home in stable condition with physical activity up as tolerated. She is to follow a 2 gram sodium diet. She should weigh herself daily, calling for more than 2-pound gain in 1 day or 5 pounds in 1 week, or increasing signs or symptoms of heart failure, and these were reviewed with the patient and her husband. We will anticipate her following up with Dr. Ubaldo Glassing within the next 1 week.   DISCHARGE MEDICATIONS 1.  Multivitamin 1 p.o. daily.  2.  Calcium 600 mg p.o. daily.  3.  Aspirin 81 mg, 2 p.o. daily.  4.  FiberCon 625 mg p.o. daily.  5.  Advair 100/50, 1 puff b.i.d.  6.  Sertraline 50 mg p.o. daily.  7.  Cardizem CD 180 mg p.o. daily.  8.  Letairis 5 mg p.o. daily.  9.  Triamcinolone 0.1% cream topically t.i.d. p.r.n.  10.  Lopressor 100 mg p.o. b.i.d.  11.  Potassium 20 mEq p.o. b.i.d.  12.  GenTeal ophthalmic gel to  the eyelids b.i.d.  13.  Lasix 80 mg p.o. b.i.d.  14.  She is given instructions to hold hydrochlorothiazide for now.  15.  She is advised to hold Celebrex for now as well and may resume this as needed only for pain.    ____________________________ Adin Hector, MD bjk:cs D: 01/24/2013 09:20:54 ET T: 01/24/2013 20:10:11 ET JOB#: [id number]  cc: Tama High III, MD, <Dictator> Ramonita Lab MD ELECTRONICALLY SIGNED 02/01/2013 12:37

## 2014-09-17 NOTE — Consult Note (Signed)
Reason for Visit: This 81 year old Female patient presents to the clinic for initial evaluation of  breast cancer .   Referred by Dr. Tamala Julian.  Diagnosis:  Chief Complaint/Diagnosis   81 year old female with early dementia stage I (T1, N0, M0) ER/PR positive invasive mammary carcinoma status post excision with positive margin for DCIS now for adjuvant radiation therapy  Pathology Report pathology report reviewed   Imaging Report mammograms reviewed   Referral Report clinical notes reviewed   Planned Treatment Regimen whole breast radiation plus boost plus aromatase inhibitor   HPI   patient is a 81 year old female with history of early stage dementia who was found to have an abnormal mammogram June 8 showing a 5 x 5 mm irregular mass at 4:00 position of the left breast. Ultrasound-guided biopsy was positive for invasive mammary carcinoma along with DCIS. Tumor was ER/PR positive HER-2/neu not over expressed.patient what Y. local excision and sentinel node biopsy for a 1.0 cm invasive mammary carcinoma well differentiated. Margins for invasive component was clear at 3 mm DCIS was positive margin. 2 sentinel lymph nodes were negative for metastatic disease. Tumor was overall grade 2. Patient has done well postoperatively. Her case was presented to her breast cancer meeting and based on the positive DCIS options for further surgery versus radiation therapy was entertained. Patient seen today for radiation oncology evaluation. She is doing fairly well quite emotional and often has early signs of dementia. She is, accompanied by multiple family members today including her husband and daughter.  Past Hx:    Macular Degeneration: NO BLOOD THINNERS, unable to take this type of medication d/t subdural hematoma per her neurologist and family insistance.   Dysphagia: takes meds w/ puree (applesauce)   Emphysema:    HTN:    reflux:    CHF:    COPD:    atrial fib:    depression:     Osteoarthritis:    hypertension:    emphysema:    Pacemaker:    Myocardial Infarct:    Atrial Fibrillation:    CVA/Stroke:    Hard of Hearing:    Removal of 2nd right toe:    Cholecystectomy:    SUBDURAL HEMATOMA:    Hemorrhoidectomy:    amputation left second toe:    Brain Surgery:   Past, Family and Social History:  Past Medical History positive   Cardiovascular atrial fibrillation; congestive heart failure; hyperlipidemia; hypertension; myocardial infarction; pacemaker   Respiratory COPD; emphysema   Gastrointestinal GERD; dysphagia   Neurological/Psychiatric Alzheimer's; CVA; depression; heart of hearing   Past Surgical History cholecystectomy; hemorrhoidectomy, amputation left second toe, subdural hematoma   Past Medical History Comments osteoarthritis   Family History positive   Family History Comments mother with leukemia sister with ovarian cancer   Social History noncontributory   Additional Past Medical and Surgical History accompanied by husband and daughter today   Allergies:   Prilosec: Itching, Rash  Shellfish: Swelling  Adhesive: Rash  Lovenox: Other  Pradaxa: Other  Etodolac: Unknown  Oxycodone: Unknown  Heparin: Do NOT Use  Coumadin: Do NOT Use  Home Meds:  Home Medications: Medication Instructions Status  furosemide 80 mg oral tablet 1 tab(s) orally every other day Active  Centrum Silver oral tablet 1 tab(s) orally once a day in applesauce. Active  Caltrate 600 with D 1 tab(s) orally once a day in applesauce. Active  aspirin 81 mg oral tablet 2 tabs (132m) orally once a day in applesauce. Active  sertraline 50 mg  oral tablet 1 tab(s) orally once a day (at bedtime) in applesauce. Active  Letairis 5 mg oral tablet 1 tab(s) orally once a day in applesauce. Active  metoprolol tartrate 100 mg oral tablet 1 tab(s) orally 2 times a day in applesauce. Active  hydrochlorothiazide 25 mg oral tablet 1 tab(s) orally once a day Active   Cartia XT 180 mg/24 hours oral capsule, extended release 1 cap(s) orally once a day Active  CeleBREX 200 mg oral capsule 1 tab(s) orally once a day Active  spironolactone 25 mg oral tablet 1 tab(s) orally once a day Active  Advair Diskus 100 mcg-50 mcg inhalation powder 1 puff(s) inhaled 2 times a day Active  donepezil 5 mg oral tablet 1 tab(s) orally once a day (at bedtime) Active  fluticasone-salmeterol 100 mcg-50 mcg inhalation powder 1 puff(s) inhaled 2 times a day Active  Estrace Vaginal 0.1 mg/g vaginal cream 1 application vaginal once a day (at bedtime) Active   Review of Systems:  Performance Status (ECOG) 1   Skin negative   Breast see HPI   Ophthalmologic negative   ENMT negative   Respiratory and Thorax negative   Cardiovascular see HPI   Gastrointestinal negative   Genitourinary negative   Musculoskeletal negative   Neurological negative   Psychiatric see HPI   Hematology/Lymphatics negative   Endocrine negative   Allergic/Immunologic negative   Review of Systems   review of systems obtained from nurses notes  Nursing Notes:  Nursing Vital Signs and Chemo Nursing Nursing Notes: *CC Vital Signs Flowsheet:   05-Aug-15 09:54  Temp Temperature 95.7  Pulse Pulse 118  Respirations Respirations 20  SBP SBP 108  DBP DBP 63  Current Weight (kg) (kg) 54   Physical Exam:  General/Skin/HEENT:  General normal   Skin normal   Eyes normal   ENMT normal   Head and Neck normal   Additional PE well-developed emotional female in NAD. Does have signs of early dementia.left breast is a wide local excision and sentinel node biopsy scar which are both well-healed. No dominant mass or nodularity is noted in either breast in 2 positions examined. Lungs are clear to A&P cardiac examination shows irregular irregular heartbeat. No axillary or supraclavicular adenopathy is appreciated.   Breasts/Resp/CV/GI/GU:  Respiratory and Thorax normal   Cardiovascular  normal   Gastrointestinal normal   Genitourinary normal   MS/Neuro/Psych/Lymph:  Musculoskeletal normal   Neurological normal   Lymphatics normal   Other Results:  Radiology Results: LabUnknown:    10-Jun-15 13:03, Digital Additional Views Lt Breast (SCR)  PACS Image     10-Jun-15 13:26, MAM [place] Left Limited  PACS [place]:    10-Jun-15 13:03, Digital Additional Views Lt Breast (SCR)  Digital Additional Views Lt Breast (SCR)   REASON FOR EXAM:    LT BREAST AV FOR MASS  COMMENTS:       PROCEDURE: MAM - MAM DGTL ADD VW LT  SCR  - Nov 03 2013  1:03PM     CLINICAL DATA:  Mass left breast identified on recent screening  mammogram. The patient's husband presents with her today.    EXAM:  DIGITAL DIAGNOSTIC  LEFT MAMMOGRAM WITH CAD    ULTRASOUND LEFT BREAST    COMPARISON:  11/01/2013 and earlier priors  ACR Breast Density Category b: There are scattered areas of  fibroglandular density.    FINDINGS:  Focal spot compression views of the outer right breast show a 6 mm  irregular mass with  indistinct margins. The images are to the best  of the patient's ability.    Mammographic images were processed with CAD.    On physical exam, I do palpate a pea-sized superficially-positioned  lump in the 4 o'clock region of the left breast approximately 4 cm  from the nipple.    Ultrasound is performed, showing a hypoechoic irregular mass with  indistinct margins at 4 o'clock position 4 cm from the nipple that  measures 5 x 5 x 5 mm.    Ultrasound of the left axilla demonstrates a normal left axillary  lymph node. No lymphadenopathy detected.     IMPRESSION:  5 mm irregular hypoechoic mass 4 o'clock position number left breast  is suspicious for malignancy.    RECOMMENDATION:  Ultrasound-guided biopsy is recommended. The findings are  recommendations were discussed with the patient and her husband. Our  office will called Dr. Thomes Dinning office with this  report.  I have discussed the findings and recommendations with the patient.  Results were also provided in writing at the conclusion of the  visit. If applicable, a reminder letter will be sent to the patient  regarding the next appointment.    BI-RADS CATEGORY  4: Suspicious.      Electronically Signed    By: Elly Modena.D.    On: 11/03/2013 13:32         Verified By: Sheppard Evens, M.D.,   Relevent Results:   Relevant Scans and Labs mammograms and ultrasound are reviewed   Assessment and Plan: Impression:   stage I invasive mammary carcinoma of the left breast this was wide local excision and sentinel node biopsy in 81 year old female with need for adjuvant treatment based on positive margin with ductal carcinoma in situ. Tumor is ER/PR positive Plan:   at this time I discussed the case personally with Dr. Tamala Julian. I believe we can forego any reexcision based on her age and other comorbidities and treat her whole breast with hypofractionated radiation therapy and posterior scar another 1600 cGy using electron beam based on the positive DCIS margin. Risks and benefits of treatment including skin reaction, fatigue, alteration blood counts, all were described in detail to the patient and her family. They all seem to comprehend my treatment plan well. I have set her up for CT simulation early next week to allow some further healing of her breast. Patient will also be a candidate for aromatase inhibitor after completion of radiation.  I would like to take this opportunity for allowing me to participate in the care of your patient..  Fax to Physician:  Physicians To Recieve Fax: Madelyn Brunner, MD - [phone] Rochel Brome - [phone].  Electronic Signatures: Armstead Peaks (MD)  (Signed 05-Aug-15 14:53)  Authored: HPI, Diagnosis, Past Hx, PFSH, Allergies, Home Meds, ROS, Nursing Notes, Physical Exam, Other Results, Relevent Results, Encounter Assessment and Plan, Fax to  Physician   Last Updated: 05-Aug-15 14:53 by Armstead Peaks (MD)

## 2014-09-17 NOTE — H&P (Signed)
PATIENT NAME:  Brittany Casey, Brittany Casey MR#:  [id number] DATE OF BIRTH:  09-25-1926  DATE OF ADMISSION:  12/25/2013  PRIMARY CARE PHYSICIAN:  Dr. Lisette Grinder.   CHIEF COMPLAINT:  Abdominal pain and diarrhea.   HISTORY OF PRESENT ILLNESS:  This is an 81 year old female who presents to the Emergency Room, brought in by her husband due to having significant abdominal pain.  The patient herself has underlying dementia and is hard of hearing therefore most of the history obtained from the husband and daughter at bedside.  As per the family, the patient was complaining of some significant abdominal pain this morning which was not improving.  She actually had some diarrhea last night as per the husband.  When she came to the Emergency Room, she had another episode of loose stools.  She was noted to be in mild acute renal failure with a creatinine up to 2.4.  Her baseline creatinine is usually around 1.5.  Hospitalist services were contacted for further treatment and evaluation.   REVIEW OF SYSTEMS:  CONSTITUTIONAL:  No documented fever.  No weight gain.  No weight loss.  EYES:  No blurred or double vision.  EARS, NOSE, THROAT:  No tinnitus, no postnasal drip.  No redness of the oropharynx.  RESPIRATORY:  No cough, no wheeze, no hemoptysis, no dyspnea.  CARDIOVASCULAR:  No chest pain, no orthopnea, no palpitation, no syncope.  GASTROINTESTINAL:  No nausea, no vomiting.  Positive diarrhea.  Positive generalized abdominal pain.  No melena or hematochezia.  GENITOURINARY:  No dysuria or hematuria.  ENDOCRINE:  No polyuria, nocturia, heat or cold intolerance.  HEMATOLOGIC:  No anemia, no bruising, no bleeding.  INTEGUMENTARY:  No rashes or lesions.  MUSCULOSKELETAL:  No arthritis, no swelling, no gout.  NEUROLOGIC:  No numbness or tingling.  No ataxia.  No seizure-type activity.  PSYCHIATRIC:  No anxiety, no insomnia.  No ADD.    PAST MEDICAL HISTORY:  Consistent with dementia, pulmonary hypertension, COPD,  depression, chronic atrial fibrillation, history of subdural hematoma.   ALLERGIES:  ETODOLAC, LOVENOX, OXYCODONE, PRADAXA, PRILOSEC, SHELLFISH, COUMADIN.   SOCIAL HISTORY:  No smoking.  No alcohol abuse.  No illicit drug abuse.  Lives at home with her husband.   FAMILY HISTORY:  The patient's mother and father are both deceased.  Mother died from leukemia.  Father died from complications of diabetes.   CURRENT MEDICATIONS:  Are as follows:  Aspirin 81 mg 2 tabs daily, Caltrate and vitamin D 1 tab daily, Cardizem CD 180 mg daily, Celebrex 200 mg daily, Centrum multivitamin daily, Aricept 5 mg at bedtime, Estrace vaginal 0.1% vaginal cream to be applied daily, Flonase 100/50 1 puff twice daily, Lasix 80 mg daily, HCTZ 25 mg daily, Letairis 5 mg daily.  Metoprolol tartrate 100 mg twice daily, sertraline 50 mg daily at bedtime, Bactrim double strength 1 tab twice daily to be stopped on August 5th, Aldactone 25 mg daily, tramadol 50 mg q. 4 hours as needed, triamcinolone 0.1% topical cream applied three times daily as needed.   PHYSICAL EXAMINATION:  Presently is as follows:  VITAL SIGNS:  Temperature is 98.4, pulse 87, respirations 18, blood pressure 119/66, sats 100% on 2 liters nasal cannula.  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  HEAD, EYES, EARS, NOSE AND THROAT:  Atraumatic, normocephalic.  Extraocular muscles are intact.  Pupils equal and reactive to light.  Sclerae is anicteric.  No conjunctival injection.  No pharyngeal erythema.  NECK:  Supple.  There is no jugular venous distention.  No bruits, no lymphadenopathy, no thyromegaly.  HEART:  Regular rate and rhythm.  No murmurs, no rubs, no clicks.  LUNGS:  Clear to auscultation bilaterally.  No rales or rhonchi.  No wheezes.  ABDOMEN:  Soft, flat, nontender, nondistended.  Has good bowel sounds.  No hepatosplenomegaly appreciated.  EXTREMITIES:  No evidence of any cyanosis, clubbing, or peripheral edema.   Has +2 pedal and radial pulses bilaterally.  NEUROLOGICAL:  The patient is alert, awake, and oriented x 3 with no focal motor or sensory deficits appreciated bilaterally.  SKIN:  Moist and warm with no rashes appreciated.  LYMPHATIC:  There is no cervical or axillary lymphadenopathy.   LABORATORY DATA:  Serum glucose of 114, BUN 48, creatinine 2.33, sodium 139, potassium 4.1, chloride 105, bicarb 29.  LFTs are within normal limits.  White cell count 6.6, hemoglobin 11.4, hematocrit 35.2, platelet count 178.  Urinalysis within normal limits.   The patient did have a CT scan of the abdomen and pelvis done without contrast which showed no acute abnormality.  The patient also had a chest x-ray done which shows a mild vascular congestion with mild interstitial edema.   ASSESSMENT AND PLAN:  This is an 81 year old female with a history of dementia, pulmonary hypertension chronic obstructive pulmonary disease, depression, chronic atrial fibrillation, history of subdural hematoma who presented to the hospital with abdominal pain and diarrhea.  1.  Abdominal pain, diarrhea.  The exact etiology of this is unclear, but could be related to a viral illness versus allergic reaction to Bactrim that she was started on two days ago for her urinary tract infection.  Her CT abdomen and pelvis noncontrast showing no acute pathology.  Clinical exam is benign and she has no significant acute abdomen signs.  She has good bowel sounds.  For now we will continue supportive care with intravenous fluids, antiemetics and antidiarrheals.  I will switch her antibiotics from Bactrim to oral Cipro for her urinary tract infection for now.  2.  Acute renal failure.  The patient's baseline creatinine is at 1.5, creatinine today is 2.4.  I suspect this is likely acute tubular necrosis due to dehydration from the diarrhea and also due to concomitant use of Bactrim which can also raise creatinine.  I will hydrate her gently with intravenous  fluids, discontinue her Bactrim, repeat her renal function in the morning.  3.  Chronic obstructive pulmonary disease.  No acute exacerbation.  Continue with Advair.  4.  History of congestive heart failure clinically, the patient is not in congestive heart failure.  I will continue her Lasix, Aldactone and metoprolol.  5.  Depression.  Continue Zoloft.  6.  History of chronic atrial fibrillation.  The patient is rate controlled.  She is status post pacemaker.  I will continue her Cardizem and metoprolol.  She is not on anticoagulation given the fact that she has a history of subdural hematoma.  7.  Dementia.  Continue with Aricept.   The patient will be transferred over to Dr. Dreama Saa service.   TIME SPENT:  50 minutes.    ____________________________ Belia Heman. Verdell Carmine, MD vjs:ea D: 12/25/2013 17:21:59 ET T: 12/25/2013 17:58:25 ET JOB#: [id number]  cc: Belia Heman. Verdell Carmine, MD, <Dictator> Henreitta Leber MD ELECTRONICALLY SIGNED 01/07/2014 14:22

## 2014-09-17 NOTE — Op Note (Signed)
PATIENT NAME:  Brittany Casey, Brittany Casey MR#:  [id number] DATE OF BIRTH:  04-12-1927  DATE OF PROCEDURE:  12/14/2013  PREOPERATIVE DIAGNOSIS: Carcinoma of the left breast.   POSTOPERATIVE DIAGNOSIS: Carcinoma of the left breast.   PROCEDURE: Left partial mastectomy with axillary sentinel lymph node biopsy.   SURGEON: Rochel Brome, M.D.   ANESTHESIA: General.   INDICATIONS: This 81 year old female recently had a mammogram depicting a nodule in the lower outer quadrant of the left breast. Ultrasound demonstrated a small superficial nodule. She had ultrasound-guided core needle biopsy which demonstrated infiltrating mammary carcinoma and surgery was recommended for definitive treatment. She did have a preop injection of radioactive technetium sulfur colloid.   DESCRIPTION OF PROCEDURE: The patient was placed on the operating table in the supine position under general anesthesia. The left breast was examined with ultrasound demonstrating the biopsy clip at the 4:00 position 4 cm from the nipple. There was also a small palpable nodule at this site.   The left breast and surrounding chest wall were prepared with ChloraPrep solution and draped in a sterile manner.   The nodule was again palpable at 4:00 position 4 cm from the nipple.  An obliquely oriented curvilinear incision was made from 3:00 to  5:30 position of the left breast removing an ellipse of skin, which was approximately 1.2 cm in width. This was carried down through subcutaneous tissues. Several small bleeding points were controlled with bipolar cautery. Next, the Harmonic scalpel was used to dissect around the palpable mass, removing some normal tissue along with the mass dissecting deeply within the breast underneath the palpable mass. The specimen, which was excised, was labeled so that the 5:30 end the skin ellipse was tagged with a stitch.  Margin maps were used to attach the specimen to mark the medial, lateral, cranial, caudal, and deep margins.  The specimen was submitted for pathology with examination of margins and call back. Attention was turned to the left axilla where the axilla was probed with a gamma counter demonstrating location of radioactivity in the inferior aspect of the axilla.   An obliquely-oriented 4 cm incision was made in the inferior aspect of the axilla and carried down through subcutaneous tissues. The Harmonic scalpel were used for hemostasis. The gamma counter was used to direct the dissection and dissected down to encounter a focal area of radioactivity. There was a palpable lymph node and the lymph node with some surrounding fatty tissue was excised with the Harmonic scalpel; however, the ex vivo count was negligible and it appeared that radioactivity would immediately be deeper to this lymph node and with further dissection, another lymph node was encountered with radioactivity and was resected with the Harmonic scalpel with some surrounding fatty tissue. The ex vivo count was in the range 40-65 counts per second. Background count was in the range of 5-15 counts per second. There was a finding of a calcified mass in the axilla, did not appear to be a lymph node, but appeared to be deep within the axilla adjacent to vascular structures and elected not to resect this.  This was a somewhat irregular calcified mass. There was no other palpable mass within the axilla. Hemostasis appeared to be intact.   The pathologist did call back to report that the partial mastectomy specimen was examined and margins appeared to be clear.  The biopsy marker was found within the central aspect of the specimen, and will be submitted for routine pathology. It is further noted that the sentinel lymph  node was submitted for routine pathology along with the other lymph node, which was not radioactive.   The breast wound was further inspected. Hemostasis appeared to be intact. Subcutaneous tissues were approximated with interrupted 4-0 chromic simple  sutures. Also subcutaneous tissues of the  axillary wound were approximated with 4-0 chromic. Next, each incision was closed with running 4-0 Monocryl subcuticular suture and both wounds were dressed with Dermabond. The patient appeared to tolerate the procedure satisfactorily, and was prepared for transfer to the recovery room.    ____________________________ Lenna Sciara. Rochel Brome, MD jws:ts D: 12/14/2013 14:02:28 ET T: 12/14/2013 14:15:48 ET JOB#: [id number]  cc: Loreli Dollar, MD, <Dictator> Loreli Dollar MD ELECTRONICALLY SIGNED 12/15/2013 9:10

## 2014-09-17 NOTE — Consult Note (Signed)
EGD was normal. Regular diet ordered. Moniter hgb. Pt had a normal colonoscopy in 2010. Not sure she can tolerate bowel prep or colonoscopy. So, would not recommend it. Transfuse as needed. Be careful of celebrex or NSAIDS use. Otherwise, no further testing at this time. Will sign off. If more problems arise, please contact GI on call. Thanks.  Electronic Signatures: Verdie Shire (MD)  (Signed on 21-Dec-15 08:58)  Authored  Last Updated: 21-Dec-15 08:58 by Verdie Shire (MD)

## 2014-09-17 NOTE — Consult Note (Signed)
PATIENT NAME:  Brittany Casey, Brittany Casey MR#:  [id number] DATE OF BIRTH:  Apr 07, 1927  DATE OF CONSULTATION:  05/15/2014  REFERRING PHYSICIAN:   CONSULTING PHYSICIAN:  Lupita Dawn. Zareth Rippetoe, MD  REASON FOR REFERRAL: Anemia with possible melena.   DESCRIPTION: The patient is an 81 year old, white female, who was brought into the Emergency Room after suffering a fall at home. The patient has dementia. It was difficult to obtain a good history. The patient denies any abdominal pain or nausea or vomiting. She denied having anorexia or weight loss. She apparently noticed some melanotic stools for the last few days. The stool was heme positive in the Emergency Room. Hemoglobin was only 6. Therefore, the patient was admitted for blood transfusion and further evaluation.   As I said, the patient really did not have any other GI symptoms. She does feel somewhat weak and lightheaded. However, she denies any chest pains or palpitations. There is some mild shortness of breath.   PAST MEDICAL HISTORY: Notable for congestive heart failure, atrial fibrillation, history of CVA. Other history includes COPD, pulmonary hypertension, arthritis, and history of breast cancer.   PAST SURGICAL HISTORY:  Includes left mastectomy, cholecystectomy and craniotomy, as well as pacemaker placement.   SOCIAL HISTORY: She lives with her husband. She does not smoke or drink.   FAMILY HISTORY: Notable for diabetes.   HOME MEDICATIONS: Include baby aspirin, Cartia XT 1 tablet once a day, calcium, Celebrex 200 mg daily, donepezil at bedtime, estrogen, fiber, and Lasix 80 mg every other day. Other medications include metoprolol, potassium, and sertraline in the evenings.   ALLERGIES: SHE IS ALLERGIC TO LOVENOX, OXYCODONE, PRADAXA, PRILOSEC, SHELLFISH, ADHESIVE TAPE. SHE IS INTOLERANT TO COUMADIN AND HEPARIN. That explains why she is not on any anticoagulants other than aspirin.   LABORATORY AND RADIOLOGIC DATA: CT of the head shows some prior infarcts.    Ultrasound shows some atrophic right kidney.   Laboratories on admission showed a sodium of 143, potassium 4.7. Troponin level was okay. White count is 11.6, hemoglobin 6.4, platelet count is 123,000.   PHYSICAL EXAMINATION:  GENERAL: The patient is in no acute distress.  VITAL SIGNS: She is afebrile. Vital signs are stable.  HEAD AND NECK: Within normal limits.  CARDIAC: She has an irregular rhythm.  LUNGS: Clear bilaterally.  ABDOMEN: Soft, nontender, nondistended. No hepatomegaly. She has active bowel sounds.  EXTREMITIES: Showed no edema.   The patient had a normal colonoscopy back in 2010. She subsequently had an upper endoscopy for dysphagia, where esophageal dilation was performed. There was a Savary dilator. There was evidence of gastritis, at that time.   IMPRESSION: This is a patient with most likely upper gastrointestinal bleeding with hemoglobin of 6 and melena. The patient takes baby aspirin  and Celebrex, which can contribute to NSAID-induced bleeding ulcers. I agree with protonix drip. I agree with blood transfusion, which is occurring at the time of my visit. We will need to make sure hemoglobin remains stable. We will plan for upper endoscopy tomorrow morning to find a bleeding source. If there are any signs of active bleeding, cauterization will be performed.   Thank you for the referral.     ____________________________ Lupita Dawn. Candace Cruise, MD pyo:JT D: 05/15/2014 10:56:41 ET T: 05/15/2014 11:51:36 ET JOB#: [id number]  cc: Lupita Dawn. Candace Cruise, MD, <Dictator> Lupita Dawn Rochelle Larue MD ELECTRONICALLY SIGNED 05/23/2014 10:55

## 2014-09-17 NOTE — Discharge Summary (Signed)
PATIENT NAME:  Brittany Casey, SALYERS MR#:  [id number] DATE OF BIRTH:  31-Jul-1926  DATE OF ADMISSION:  02/03/2014 DATE OF DISCHARGE:  02/07/2014  DISCHARGE DIAGNOSES:  1.  Accidental overdose of calcium-channel blockers and beta-blockers.  2.  Dementia.  3.  Chronic obstructive pulmonary disease.  4.  History of atrial fibrillation.  5.  History of hypertension.   CHIEF COMPLAINT: Accident overdose.   HISTORY OF PRESENT ILLNESS: Brittany Casey is an 81 year old female with a history of dementia, who took her husband's medications, i.e., Cardizem, enalapril, Lasix, amlodipine, clonidine, tamsulosin, and aspirin, in addition to her own medications, which are metoprolol, Letairis, and Celebrex. The patient was brought to the ER, was noted to be hypotensive and, after she received 3 liters of normal saline, her blood pressure was still low at 77/40 with a heart rate of 80s with a decreased urine output. She was admitted to the CCU. Discussions were held with poison control, who recommended calcium gluconate as well as Levophed to support her blood pressure.   PAST MEDICAL HISTORY: Significant for hypertension, dementia, pulmonary hypertension, COPD, chronic atrial fibrillation, history of subdural hematoma. Please see H and P for other details.   The patient was admitted initially to the CCU.    LABORATORY DATA: WBC of 5.3, hemoglobin 11, hematocrit 33.4, platelets 155,000. Sodium is 140, potassium 4, chloride 101, bicarbonate 33, BUN 47, creatinine 1.9, glucose of 89, BNP was 2642. Troponin less than 0.02.   HOSPITAL COURSE: The patient continued to have episodes of confusion from her dementia; however, her blood pressure gradually came back to baseline and she was restarted back on her blood pressure medications. She was transferred to the floor. She did have some problems with urinary incontinence for which a Foley was inserted and this was subsequently discontinued at the time of discharge. The patient's  mental status was at baseline, and discussions were held with the family. She was also seen by physical therapy, and she was discharged in stable condition on the following medications.   DISCHARGE MEDICATIONS: Letairis 5 mg 1 tablet a day, metoprolol tartrate 100 mg p.o. b.i.d., HCTZ 25 mg once a day, Cartia XT 180 mg once a day, Celebrex 200 mg once a day, furosemide 80 mg every other day, aspirin 81 mg a day, vitamin D3 one tablet once a day, Centrum Silver 1 tab once a day, sertraline 50 mg once a day, donepezil 5 mg daily and fluticasone salmeterol inhaler 100/50 one puff b.i.d.   FOLLOWUP: The patient was advised a low sodium diet and to drink plenty of fluids and to keep her followup appointment soon with Dr. Dario Ave, III, in the clinic in 1 to 2 weeks' time. The patient was stable at the time of discharge.   TOTAL TIME SPENT IN DISCHARGING THE PATIENT: Thirty-five minutes   ____________________________ Tracie Harrier, MD vh:lr D: 02/07/2014 12:57:28 ET T: 02/07/2014 15:18:05 ET JOB#: [id number]  cc: Tracie Harrier, MD, <Dictator> Tracie Harrier MD ELECTRONICALLY SIGNED 02/15/2014 17:55

## 2014-09-17 NOTE — H&P (Signed)
PATIENT NAME:  Brittany Casey, MINCHEW MR#:  [id number] DATE OF BIRTH:  1927/01/30  DATE OF ADMISSION:  02/03/2014  PRIMARY DOCTOR: Dr. Gilford Rile.  EMERGENCY ROOM PHYSICIAN: Lenise Arena, MD  CHIEF COMPLAINT: Accidental overdose.   HISTORY OF PRESENT ILLNESS: An 81 year old female with dementia, accidentally took her husband's medications, namely Cardizem CD 300 mg, enalapril 10 mg, furosemide 40 mg, amlodipine 5 mg, clonidine 0.1 mg, tamsulosin 0.4 mg, aspirin 81 mg in addition to her medications which are metoprolol 100 mg and Letairis 5 mg and also her Celebrex. According to ER triage note, the patient took her husband's medications and also her medications, but I discussed this with her husband and daughter. Husband said that she did not take all her medications. She took only her arthritis medicine, metoprolol and Letairis in addition to the medicine I have told in my dictation before. The patient was brought into the Emergency Room. She was hypotensive. She received about 3 liters of normal saline so far. Her blood pressure is low at 77/40 and she has heart rate in 80s, and she did not have any urine output, even after 3 liters of normal saline. [facility] was called. They recommended monitoring the urine output because of the overdose of hypertensive medications and also calcium channel blockers. The patient also needs pressors. Dietrich recommended to start Levophed, so we will start the patient on Levophed drip, admit her to ICU. The patient received calcium gluconate 3 grams total and the patient is alert and oriented, denies any complaints. No chest pain, no trouble breathing, no dizziness.   PAST MEDICAL HISTORY: Significant for history of hypertension, dementia, pulmonary hypertension, COPD, chronic atrial fibrillation, history of subdural hematoma.   ALLERGIES: THE PATIENT ALLERGIC TO KETORALAC, LOVENOX, OXYCODONE, PRADAXA, PRILOSEC, SHELLFISH, COUMADIN.  SOCIAL HISTORY:  No smoking. No drinking. No drugs. Lives with husband.   FAMILY HISTORY: Mother and father are deceased. The patient's mother died of leukemia. Father died of complications from diabetes.  REVIEW OF SYSTEMS: The patient denies any complaints, of course she has dementia, so unable to get complete reliable review of systems.   MEDICATIONS: She takes Advair Diskus 100/50 one puff b.i.d., aspirin 81 mg daily, Cartia XT 180 mg extended release daily, Celebrex 200 mg p.o. daily, donepezil 5 mg daily, furosemide 80 mg daily, hydrochlorothiazide 25 mg p.o. daily, Letairis 5 mg p.o. daily, metoprolol tartrate 100 mg p.o. b.i.d., Zoloft 50 mg p.o. daily, Aldactone 25 mg p.o. daily, vitamin D3 one tablet daily.   PHYSICAL EXAMINATION:  VITAL SIGNS: Temperature 97.4, heart rate 93, blood pressure initially 62/43 and repeat blood pressure is 70/50.  GENERAL: The patient is awake and oriented but very hard of hearing. The patient is a well-developed, well-nourished female, not in distress.  HEAD: Normocephalic, atraumatic.  EYES: Pupils equally reacting to light. No conjunctival pallor. No scleral icterus, extraocular movements are intact.  NOSE: No nasal lesions. No drainage.  EARS: No drainage or external lesions.  MOUTH: No lesions. No exudates.  NECK: Supple. No JVD. No carotid bruit. Normal range of motion.  RESPIRATORY: Good respiratory effort. Clear to auscultation. No wheeze noted.  CARDIOVASCULAR: S1, S2 regular. The patient's PMI is not displaced. No peripheral edema.  GASTROINTESTINAL: Abdomen is nontender, nondistended. Bowel sounds present. No organomegaly. No hernias.  MUSCULOSKELETAL: Extremities move x 4. Normal range of motion.  NEUROLOGIC: The patient's cranial nerves II-XII intact. Power 5/5 in upper and lower extremities. Sensation intact. DTRs 2+ bilaterally.  PSYCHIATRIC: The  patient is demented.   LABORATORY DATA: WBC 5.3, hemoglobin 11, hematocrit 33.4, platelets 155,000.  Electrolytes: Sodium is 140, potassium 4, chloride 101, bicarb 33, BUN 47, creatinine 1.90, glucose 89. The patient's BNP 2642. Troponin less than 0.02.  Magnesium 1.7. The patient's creatinine on August 2nd was 1.9.   IMAGING: EKG: Pacemaker rhythm is 80 beats per minute.   ASSESSMENT AND PLAN:  1.  The patient is an 81 year old female with accidental overdose of calcium channel blockers and beta blockers. Admit her to intensive care unit. The patient still hypotensive even after 3 liters of fluids. The patient also received serum calcium gluconate 3 amps. [facility], they recommended to start her on pressors. We will start vasopressors with Levophed and see how the blood pressure responds and continue IV fluids, normal saline at 100 mL/h. She also received 4 amps of calcium gluconate. The patient needs to be monitored for urine output, so we inserted a Foley catheter and monitor urine output. We are going to start her on calcium drip because of calcium channel blocker toxicity. The patient will have infusion calcium gluconate at 0.6 to 1.2 mL/kg per hour, and we will recheck calcium levels every 2 hours, target values are 2 times the normal.  2.  The patient's mental status is alert and oriented. The patient's lactate is normal, but urine output is very poor so we have to monitor the urine output and continue IV fluids and admit to the ICU and monitor closely, and we will check the echocardiogram at bedside to look at the cardiac contractibility. According to [facility], if the echocardiogram shows ejection fraction of less than 45% they recommended high-dose insulin drip to improve the cardiac contractility, so that will be added after the echocardiogram.   TIME SPENT: About 60 minutes on this critical care.   CODE STATUS: At this time is full code.   Discussed this plan with the patient's husband and daughter, who are very involved in the care.     ____________________________ Epifanio Lesches, MD sk:TT D: 02/03/2014 14:42:09 ET T: 02/03/2014 20:30:34 ET JOB#: [id number]  cc: Epifanio Lesches, MD, <Dictator> Epifanio Lesches MD ELECTRONICALLY SIGNED 02/23/2014 14:53

## 2014-09-17 NOTE — H&P (Signed)
PATIENT NAME:  Brittany Casey, Brittany Casey MR#:  [id number] DATE OF BIRTH:  1926/09/25  DATE OF ADMISSION:  05/15/2014  REFERRING PHYSICIAN: Briant Sites. Joni Fears, M.D.   PRIMARY CARE PHYSICIAN: Dr. Gilford Rile with the [facility].  ADMIT DIAGNOSIS: Upper gastrointestinal bleed.   HISTORY OF PRESENT ILLNESS: This is an 81 year old, Caucasian female, who presents to the Emergency Department via EMS after suffering a fall at home. The patient sustained a skin tear, and presumably fell due to weakness or lightheadedness. In the Emergency Department, she was seen to have some melanotic stools, which indeed were Hemoccult positive, and then found to have a hemoglobin of 6.4 grams, which prompted the Emergency Department to call for admission for acute care of GI bleed.   REVIEW OF SYSTEMS: The patient is pleasantly demented, and states that she is not in any pain. She denies shortness of breath, chest pain or abdominal pain, but admits to having some incontinence of stool, which she is unable to characterize fully. Beyond this, the patient is unable to contribute to her own review of systems.   PAST MEDICAL HISTORY: Congestive heart failure, hypertension, atrial fibrillation, history of CVA, COPD, pulmonary hypertension, dementia, macular degeneration, osteoarthritis, and history of breast cancer.   PAST SURGICAL HISTORY: Left mastectomy, toe amputation, cholecystectomy, craniotomy for a subdural hematoma, as well as pacemaker placement.   SOCIAL HISTORY: The patient lives with her husband. She does not smoke, drink or do any drugs, but she is also unable to complete most of her activities of daily living.   FAMILY HISTORY: The patient states her father is deceased of complications from diabetes.   MEDICATIONS:  1. Aspirin 81 mg 1 tablet p.o. daily.  2. Calcium 600 plus vitamin D 1 tablet p.o. daily.  3. Cartia XT 180 mg per 24-hour extended release capsule, 1 capsule p.o. daily.  4. Celecoxib 200 mg 1 capsule  p.o. daily.  5. Donepezil 10 mg 1 tablet p.o. at bedtime.  6. Estrace vaginal cream 0.1 mg/g apply Monday, Wednesdays, and Fridays.  7. Fiber tablets 625 mg 1 tablet p.o. daily.  8. Furosemide 80 mg 1 tablet p.o. every other day.  9. Hydrochlorothiazide 25 mg 1 tablet p.o. every morning.  10. Letairis 5 mg 1 tablet p.o. every morning.  11. Metoprolol tartrate 100 mg 1 tablet p.o. b.i.d.  12. Multivitamin 1 tablet p.o. daily.  13. Potassium chloride 20 mEq extended release tablet 1 tablet p.o. every morning.  14. Sertraline 50 mg 1 tablet p.o. every evening.   ALLERGIES: ETODOLAC LOVENOX, OXYCODONE, PRADAXA, PRILOSEC, SHELLFISH, AND ADHESIVE. THE PATIENT HAS INTOLERANCES TO COUMADIN AND HEPARIN.   PERTINENT RESULTS AND RADIOGRAPHIC FINDINGS: Serum glucose is 134, BUN 56, creatinine is 2.26, sodium is 143, potassium 4.7, chloride 106, bicarbonate 27, calcium is 8.2. Troponin is 0.03, white blood cell count 11.6, hemoglobin is 6.4, hematocrit is 20.1, platelet count is 123,000.   CT of the head without contrast shows atrophy with prior infarcts and small vessel disease. There are no new gray to white compartment lesions.   Ultrasound of the aorta shows no evidence of aneurysm, but there is an atrophic right kidney.    PHYSICAL EXAMINATION:  VITAL SIGNS: Temperature is 97.9, pulse 98, respirations 18, blood pressure 106/46, pulse oximetry 100% on room air.  GENERAL: The patient is alert and oriented x 2, in no apparent distress.  HEENT: Normocephalic, atraumatic. Pupils equal, round, and reactive to light and accommodation. Extraocular movements are intact. Mucous membranes are moist.  NECK:  Trachea is midline. No adenopathy.  CHEST: Symmetric and atraumatic.  CARDIOVASCULAR: Irregular rhythm with normal S1, S2. No rubs, clicks, or murmurs appreciated.  LUNGS: Clear to auscultation bilaterally. Normal effort and excursion.  ABDOMEN: Positive bowel sounds. Soft, nontender, nondistended. No  hepatosplenomegaly.  GENITOURINARY: Deferred.  MUSCULOSKELETAL: The patient moves all 4 extremities equally. Has 5/5 strength in upper and lower extremities bilaterally.  NEUROLOGIC: Cranial nerves II through XII are grossly intact.  PSYCHIATRIC: Mood is normal. Affect is congruent.   ASSESSMENT AND PLAN: This is an 81 year old female with a gastrointestinal bleed.   1. Upper gastrointestinal bleed. The patient has melanotic stools and is guaiac-positive. We started her on a Protonix drip and are fluid resuscitating her. A gastroenterology consult has been ordered, and we will transfuse 1 unit of packed red blood cells for her anemia.  2. Hypotension. The patient was questionably lightheaded and certainly fell at home. She is tachycardic here that is symptomatic of her anemia. We will continue to volume resuscitate her and transfuse blood.  3. Acute on chronic kidney disease. This is likely exacerbated by her gastrointestinal bleed. We will continue to hydrate the patient and avoid nephrotoxic agents.  4. Leukocytosis and mild thrombocytopenia. This is likely secondary to gastrointestinal bleed and uremia, respectively. Her platelet count is not so low as to necessitate whole blood or platelet transfusion at this time.  5. Congestive heart failure. The patient does have some impaired contractility of the heart, but she has a normal ejection fraction with some valvular disease. She is stable and we will continue her regimen of Lasix and hydrochlorothiazide, especially in light of the fact that she is receiving extra blood volume.  6. Hypertension. We will hold metoprolol for now, as the patient is mildly hypotensive. Once she is volume resuscitated, she will likely need this medication for blood pressure and rate control.  7. Atrial fibrillation. We will continue diltiazem. The patient is not on any anticoagulation except for aspirin, presumably due to her fall risk, but we have held this, as well, as  she currently has a gastrointestinal bleed.  8. Dementia. The patient is stable and pleasant. We will continue sertraline and Aricept.  9. Gastrointestinal prophylaxis is a Protonix drip, as above. Deep vein thrombosis prophylaxis is sequential compression devices.   TIME SPENT ON PATIENT CARE AND ADMISSION ORDERS: Approximately 35 minutes.    ____________________________ Norva Riffle. Marcille Blanco, MD msd:JT D: 05/15/2014 06:15:06 ET T: 05/15/2014 08:17:31 ET JOB#: [id number]  cc: Norva Riffle. Marcille Blanco, MD, <Dictator> Norva Riffle DIAMOND MD ELECTRONICALLY SIGNED 05/16/2014 0:13

## 2014-09-17 NOTE — Discharge Summary (Signed)
PATIENT NAME:  Brittany Casey, Brittany Casey MR#:  [id number] DATE OF BIRTH:  1926-10-29  DATE OF ADMISSION:  12/25/2013 DATE OF DISCHARGE:  12/26/2013  PRIMARY CARE PROVIDER: Hewitt Blade. Sarina Ser, M.D.   DISCHARGE DIAGNOSES:  1.  Gastroenteritis.  2.  Acute on chronic renal insufficiency.   DISCHARGE MEDICATIONS: Medication Instructions  centrum silver oral tablet  1 tab(s) orally once a day in applesauce.   caltrate 600 with d  1 tab(s) orally once a day in applesauce.   aspirin 81 mg oral tablet  2 tabs (162mg ) orally once a day in applesauce.   sertraline 50 mg oral tablet  1 tab(s) orally once a day (at bedtime) in applesauce.   letairis 5 mg oral tablet  1 tab(s) orally once a day in applesauce.   metoprolol tartrate 100 mg oral tablet  1 tab(s) orally 2 times a day in applesauce.   hydrochlorothiazide 25 mg oral tablet  1 tab(s) orally once a day   cartia xt 180 mg/24 hours oral capsule, extended release  1 cap(s) orally once a day   celebrex 200 mg oral capsule  1 tab(s) orally once a day   spironolactone 25 mg oral tablet  1 tab(s) orally once a day   furosemide 80 mg oral tablet  1 tab(s) orally every other day   donepezil 5 mg oral tablet  1 tab(s) orally once a day (at bedtime)   fluticasone-salmeterol 100 mcg-50 mcg inhalation powder  1 puff(s) inhaled 2 times a day   triamcinolone topical 0.1% topical cream  Apply topically to affected area 3 times a day, As Needed   estrace vaginal 0.1 mg/g vaginal cream  1 application vaginal once a day (at bedtime)   tramadol 50 mg oral tablet  1 tab(s) orally every 4 hours as needed for pain       HISTORY OF PRESENT ILLNESS: This is an 81 year old female with a history of chronic renal insufficiency, dementia, COPD, chronic atrial fibrillation, pulmonary hypertension who presented with abdominal pain and diarrhea. Initial blood work showed acute renal failure with a creatinine up to 2.4 (baseline 1.5). She was hemodynamically stable and afebrile.  Laboratory studies were otherwise normal, notably urinalysis was normal and white blood cell count was only 6.6. The patient had been treated several days prior, with Bactrim for a urinary tract infection. She was taking Bactrim on the day of admission. She underwent a CT abdomen and pelvis in the ED which showed no acute abnormality. Chest x-ray showed mild vascular congestion and mild interstitial edema.   HOSPITAL COURSE: The patient was admitted and started on IV fluids for acute renal failure, which was initially deemed to be due to dehydration versus concern for ATN secondary to use of Bactrim. Bactrim was held. She was given antiemetics and antidiarrheals. She had resolution of her nausea and diarrhea by the 2nd hospital day. Additionally, her creatinine improved with fluids to 1.90. She remained afebrile and had an otherwise unremarkable course. As she was feeling back to baseline on the 2nd hospital day, she was discharged home. There were no changes in home medications except advised to hold the Bactrim. She was given 1 dose of ciprofloxacin for this prior urinary tract infection, however, that was not continued at discharge as her urinalysis was normal.  DISCHARGE INSTRUCTIONS:  Follow up with Dr. [name] B. Walker in 2-3 weeks.    ____________________________ A. Lavone Orn, MD ams:lt D: 12/26/2013 09:58:29 ET T: 12/26/2013 10:33:08 ET JOB#: [id number]  cc: A. Lavone Orn, MD, <Dictator> John B. Sarina Ser, MD Gracy Bruins Ignazio Kincaid MD ELECTRONICALLY SIGNED 12/26/2013 13:14

## 2014-09-17 NOTE — Consult Note (Signed)
Pt seen and examined. Full consult to follow. Pt with dementia. Getting blood transfusion right now. Difficult to obtain good hx. Denies any abdominal pain, nausea, diarrhea, indigestion, or anorexia. Melena for few days? Had normal colonoscopy in 2010. Had EGD with Savary dilation also in 2010. At the time, patient had gastritis. Hgb only 6 today. Will plan EGD tomorrow AM to check for bleeding ulcer. Thanks.  Electronic Signatures: Verdie Shire (MD)  (Signed on 20-Dec-15 09:42)  Authored  Last Updated: 20-Dec-15 09:42 by Verdie Shire (MD)

## 2014-09-18 NOTE — Discharge Summary (Signed)
PATIENT NAME:  Brittany Casey, Brittany Casey MR#:  [id number] DATE OF BIRTH:  Jul 23, 1926  DATE OF ADMISSION:  10/02/2011 DATE OF DISCHARGE:  10/06/2011  DISCHARGE DIAGNOSES:  1. Acute on chronic diastolic congestive heart failure.  2. Pulmonary hypertension.  3. Hypokalemia.  4. Hypertension.  5. Depression.  6. Sick sinus syndrome with pacemaker.  7. History of atrial fibrillation.  8. History of cerebrovascular accident.   DISCHARGE MEDICATIONS:  1. Aspirin 162 mg p.o. daily.  2. Advair 100/50 one puff b.i.d.  3. Sertraline 50 mg p.o. daily.  4. Diltiazem 180 mg p.o. daily.  5. Letairis 5 mg p.o. daily. 6. Metoprolol tartrate 100 mg p.o. b.i.d.  7. Lasix 40 mg p.o. in the morning and 20 mg early afternoon.  8. Hydrochlorothiazide 25 mg p.o. daily.  9. Potassium chloride 20 mEq p.o. b.i.d.   CONSULTS:  1. Cardiology per Dr. Saralyn Pilar.  2. Pulmonology per Dr. Raul Del.   PROCEDURES: The patient underwent an echocardiogram that showed an ejection fraction of 65% but consistent with diastolic heart failure.   PERTINENT LABS ON DAY OF DISCHARGE: Sodium 140, potassium 3.1. BUN 34, creatinine 1.36, glucose 100, white blood cell count 5.5, hemoglobin 10.6, platelets 172. Her weight on discharge was 138 pounds.   BRIEF HOSPITAL COURSE:  1. Acute on chronic diastolic congestive heart failure. The patient initially came in with complaints of shortness of breath and found to be volume overloaded on exam. She was placed on Lasix and her volume status improved. She had no further peripheral edema. Her lung exam improved. She has no crackles on exam today. She will continue on a higher dose of Lasix at home and follow her electrolytes closely.  2. Pulmonary hypertension. She will continue on the regimen per Dr. Raul Del.   3. Hypokalemia due to her diuresis. We will increase the potassium chloride to 20 mEq b.i.d. We will need to recheck potassium  this week per Dr. Gilford Rile.   4. Other chronic medical issues  remained stable. She was discharged on the same medications per home regimen.   DISPOSITION: She is in stable condition and will be discharged to home. She does not need therapy. She needs assistance with activity. She will continue on home O2, which she was on previously at 2 liters at night and as needed.   FOLLOW-UP: Follow-up with Dr. Gilford Rile quickly in 1 to 3 days.   ____________________________ Dion Body, MD kl:rbg D: 10/06/2011 07:36:20 ET T: 10/08/2011 10:50:37 ET JOB#: [id number]  cc: Dion Body, MD, <Dictator> John B. Sarina Ser, MD Dion Body MD ELECTRONICALLY SIGNED 10/25/2011 17:30

## 2014-09-18 NOTE — Consult Note (Signed)
Brief Consult Note: Diagnosis: CHF, probable diastolic dysfunction in setting of pulmonary hypertension.   Patient was seen by consultant.   Consult note dictated.   Comments: REC  Agree with current therapy, cont diuresis, monitor renal status closely, no further cardiac daignostics at this time.  Electronic Signatures: Isaias Cowman (MD)  (Signed 08-May-13 16:59)  Authored: Brief Consult Note   Last Updated: 08-May-13 16:59 by Isaias Cowman (MD)

## 2014-09-18 NOTE — Consult Note (Signed)
PATIENT NAME:  Brittany Casey, Brittany Casey MR#:  [id number] DATE OF BIRTH:  01/08/1927  DATE OF CONSULTATION:  10/02/2011  REFERRING PHYSICIAN:  Vivien Presto, MD    CONSULTING PHYSICIAN:  Isaias Cowman, MD  PRIMARY CARE PHYSICIAN: Hewitt Blade. Sarina Ser, MD   CARDIOLOGIST: Bartholome Bill, MD   CHIEF COMPLAINT: Shortness of breath.   HISTORY OF PRESENT ILLNESS: The patient is an 81 year old female with a history of congestive heart failure and moderate-to-severe  pulmonary hypertension, referred for evaluation of shortness of breath. The patient apparently has been in her usual state of health until the past week when she has noted increase in fluid retention and pedal edema. She was actually seen in the [facility] Emergency Room yesterday morning and was discharged home, only to return later today with persistent shortness of breath. The patient was admitted to telemetry where she reports some modest clinical improvement. Admission labs were notable for a BNP of 6478. Troponin was 0.02. The patient denies chest pain. EKG was nondiagnostic. The patient is anemic with a hemoglobin and hematocrit of 9.6 and 28.4, respectively.   PAST MEDICAL HISTORY:  1. Congestive heart failure with minimal coronary artery disease, presumed to be due to diastolic dysfunction. 2. Moderate-to-severe pulmonary hypertension documented by right heart catheterization 08/20/2011.  3. Hypertension.  4. Atrial fibrillation.  5. Sick sinus syndrome, status post pacemaker.  6. History of cerebrovascular accident.   MEDICATIONS ON ADMISSION:  1. Aspirin 81 mg daily.  2. Diltiazem ER 180 mg daily.  3. Furosemide 40 mg daily.  4. Klor-Con 20 mEq daily.  5. Metoprolol 150 mg daily.  6. Advair 100/50, 1 puff b.i.d.  7. Caltrate 600 mg with D 1 daily.  8. Celebrex 200 mg daily.  9. Centrum Silver 1 daily.  10. FiberCon 625 mg daily.  11. Letairis 5 mg daily.  12. Sertraline 50 mg daily.   SOCIAL HISTORY:  The patient currently resides with her husband. She has a remote tobacco use history.   FAMILY HISTORY: Father is status post myocardial infarction in his 53s.   REVIEW OF SYSTEMS: CONSTITUTIONAL: No fever or chills. EYES: No blurry vision. EARS: No hearing loss. RESPIRATORY: Shortness of breath as described above. CARDIOVASCULAR: The patient has occasional chest tightness. GASTROINTESTINAL: No nausea, vomiting, or diarrhea. GU: No dysuria or hematuria. ENDOCRINE: No polyuria or polydipsia. HEMATOLOGICAL: No easy bruising or bleeding. MUSCULOSKELETAL: No arthralgias or myalgias. NEUROLOGICAL: The patient has had a previous history of stroke and subdural hematoma. PSYCHOLOGICAL: The patient has depression.   PHYSICAL EXAMINATION:  VITAL SIGNS: Blood pressure 162/78, pulse 103, respirations 20, temperature 98, pulse oximetry 92%.   HEENT: Pupils are equal and reactive to light and accommodation.   NECK: Supple without thyromegaly.   LUNGS: Decreased breath sounds in both lung bases.   HEART: Normal jugular venous pressure. Normal point of maximal impulse. Irregular regular rhythm. Normal S1, S2. No appreciable gallop, murmur, or rub.   ABDOMEN: Soft and nontender. Pulses were intact bilaterally.   MUSCULOSKELETAL: Normal muscle tone.  NEUROLOGICAL: The patient was alert and oriented, had poor insight. Motor and sensory were both grossly intact.   ACCESSORY DATA: EKG revealed atrial fibrillation at a rate of 86 beats per minute.   IMPRESSION: An 81 year old female who presents with fluid retention, pedal edema likely multifactorial, with history of congestive heart failure, minimal coronary artery disease, and diastolic dysfunction, as well as moderate-to-severe pulmonary hypertension documented by right heart catheterization.   RECOMMENDATIONS:  1. I  agree with overall current therapy.  2. Continue diuresis with intravenous furosemide.  3. Continue to follow BUN and creatinine closely.   4. Sodium restriction.  5. Fluid restriction.  6. No further cardiac noninvasive or invasive evaluation at this time.   ____________________________ Isaias Cowman, MD ap:cbb D: 10/02/2011 16:57:43 ET T: 10/02/2011 17:54:30 ET JOB#: [id number]  cc: Isaias Cowman, MD, <Dictator> Isaias Cowman MD ELECTRONICALLY SIGNED 10/03/2011 10:59

## 2014-09-18 NOTE — H&P (Signed)
PATIENT NAME:  Brittany Casey, Brittany Casey MR#:  [id number] DATE OF BIRTH:  08/14/1926  DATE OF ADMISSION:  10/02/2011  REFERRING PHYSICIAN: Dr. Graciella Freer   PRIMARY CARE PHYSICIAN: Dr. [name] B. Gilford Rile, III at [place]: Dr. Bartholome Bill.   CHIEF COMPLAINT: Shortness of breath.   HISTORY OF PRESENT ILLNESS: The patient is an 81 year old Caucasian female with a history of moderate-to-severe pulmonary hypertension, history of congestive heart failure, subdural hematoma after a fall-on Coumadin, history of CVA and atrial fibrillation status post pacemaker also for sick sinus syndrome, presents with the above complaint. The patient states that for the past week or so she has had increased shortness of breath and has gained greater than 10 pounds. She has occasional chest pains as well. She has had increased lower extremity edema, however, currently that is less. She has no cough, fevers or chills. She has no wheezing. The patient has had a recent right heart catheterization on 08/20/2011 by Dr. Ubaldo Glassing showing moderate-to-severe pulmonary hypertension. On arrival, she was found to have elevated BNP of 6478 without leukocytosis. Of note, the patient also had presented to the ED yesterday complaining of similar symptoms and also abdominal pain, and a CT of the abdomen and pelvis without contrast was done showing cardiomegaly and pleural effusions, larger on the right. X-ray of the chest today also shows that it is consistent with congestive heart failure and mild edema. Hospitalist Service was contacted for further evaluation and management. The patient has been given Lasix 40 mg IV here.   The patient had presented to her primary care physician this morning after the ER visit yesterday. Her pulse oximetry was noted to be 75% on room air; and, therefore, she was recommended to come to the ER for further evaluation and management per husband, who is in the room. The patient is on 2 liters chronically  during the night, however, not on oxygen during the day.   PAST MEDICAL HISTORY:  1. Hypertension. 2. Osteoarthritis. 3. Chronic depression. 4. Atrial fibrillation. 5. Sick sinus syndrome, status post permanent pacemaker. 6. History of congestive heart failure, unclear if it is systolic or diastolic, but per chart she had a catheterization on 09/14/2006 revealing minimal coronary artery disease and ejection fraction of 50%.  7. History of gastritis. 8. History of subdural hematoma after a fall-on Coumadin. 9. History of cerebrovascular accident.   PAST SURGICAL HISTORY:  1. Cholecystectomy. 2. Cervical fusion for spinal stenosis. 3. Diskectomy and anterior decompression. 4. Hemorrhoid surgery.   ALLERGIES: Coumadin, etodolac, oxycodone, Prilosec, shellfish, adhesive tape, Prilosec.   CURRENT MEDICATIONS:  1. Advair 100/50, 1 puff b.i.d.  2. Aspirin 81 mg, 2 tabs daily. 3. Caltrate 600 with D, 1 tab daily.  4. Celebrex 200 mg daily.  5. Centrum Silver 1 tab daily.  6. Diltiazem Extended Release 180 mg, 1 tab daily.  7. FiberCon 625 mg daily.  8. Furosemide 40 mg daily.  9. Hydrochlorothiazide 50 mg daily.  10. Klor-Con Extended Release 20 mEq, 1 tab daily.  11. Letairis 5 mg daily.  12. Metoprolol. The patient states she takes 150 mg per day, but don't know if this is extended release or tartrate.  13. Sertraline 50 mg daily.  14. Triamcinolone 0.1% topical cream, apply to affected areas as needed.   SOCIAL HISTORY: She lives with her husband. She denies tobacco, alcohol or drug use.   REVIEW OF SYSTEMS: CONSTITUTIONAL: No fever. There is some fatigue and overall weakness. Weight gain as above.  EYES: No double vision, blurry vision. ENT: No tinnitus, snoring. There is decreased hearing bilaterally. RESPIRATORY: No cough or wheezing or hemoptysis. There is dyspnea on exertion. History of chronic obstructive pulmonary disease, history of pulmonary hypertension and shortness of  breath. CARDIOVASCULAR: Occasional chest pains. Denies orthopnea. Occasional edema. Has a history of atrial fibrillation and sick sinus syndrome, status post pacemaker. History of congestive heart failure. GI: Has abdominal pain. It is in the upper epigastrium. Besides this, she cannot provide more information. No nausea, vomiting, or diarrhea. Has constipation for one day. No bright red blood per rectum. No melena or tarry stools. GENITOURINARY: Denies dysuria, however, states she has difficulty initiating stream and feels like she is urinating glass. ENDOCRINE: Denies polyuria, nocturia, or thyroid problems. HEME/LYMPH: Has a history of anemia. No history of swollen glands. SKIN: Denies any new rashes. MUSCULOSKELETAL: Has chronic arthritis. NEUROLOGICAL: Has a history of stroke and subdural hematoma. PSYCHIATRIC: Has depression.   PHYSICAL EXAMINATION:  VITAL SIGNS: Temperature on arrival 98, pulse rate was 99, respiratory rate 20, blood pressure 129/78. Oxygen saturation was 84% on room air vital sheet. However, the patient also did have 74% oxygen saturation on room air  at the doctor's visit.   GENERAL: The patient is a Caucasian elderly female sitting in bed. No obvious distress.   HEENT: Normocephalic, atraumatic. Pupils are equal and reactive. Anicteric sclerae. Moist mucous membranes.   NECK: I can't appreciate any JVD, supple. No thyroid tenderness.   CARDIOVASCULAR: S1, S2, irregularly irregular. No murmurs appreciated.   LUNGS: Decreased breath sounds at the bases bilaterally with some mild crackles, otherwise good air entry.   ABDOMEN: Soft, nontender, nondistended. No guarding or rebound. Positive bowel sounds in all quadrants.   EXTREMITIES: There appears to be no significant lower extremity edema.   NEUROLOGICAL: Cranial nerves II through XII appear to be grossly intact. Strength five out of five in all extremities.   PSYCHIATRIC: Awake, alert, oriented x3, appears somewhat  anxious.  LABORATORY, DIAGNOSTIC AND RADIOLOGICAL DATA:  EKG shows atrial fibrillation in comparison to yesterday's EKG which was paced. Today's EKG shows some T wave inversions and some ST depressions in V4, V5, and V6 as well as some T wave inversions in inferior leads as well as V3; however, when you look back to EKG from July 2012, I do see the ST depressions in V3 to V6 as well and the T wave inversions, which appear to be deep. There are also the T wave inversions in III, aVF and II.  Glucose is 102. BNP is 6478. BUN is 40. Creatinine today is 1.61, yesterday it was 1.57. Potassium 3.7, sodium 138.  LFTs: AST is a little bit elevated today than yesterday, today is 92. Albumin is 3.3, otherwise within normal limits.  Troponin negative. CK-MB is 2.1. CK total 166.  WBC 9, hemoglobin 9.6, hematocrit is 28.4; yesterday hemoglobin was 10.8, and hematocrit was 31.8, platelet count is 144.  Urinalysis yesterday showed 1+ leukocyte esterase, trace bacteria, and 7 WBCs; today less than 1 WBC, no nitrites or leukocyte esterase, and no bacteria seen.  There is a cardiac catheterization from March 26th showing moderate-to-severe pulmonary hypertension.  CT of the abdomen and pelvis without contrast done yesterday shows cardiomegaly, pleural effusions-larger on the right, which incompletely assessed. Atherosclerotic disease. Cannot exclude mild thickening of the gastric wall; but given the nondistended state of the stomach, this certainly may be artifact.  X-ray of the chest, PA and lateral yesterday, showing cardiomegaly with  pulmonary edema suggesting of congestive heart failure, small left pleural effusion. Old healed right and left rib fractures, pacemaker, atherosclerotic disease, calcified lymph node noted in the left axilla, and previous anterior cervical fusion in the posterior upper thoracic and possibly lower cervical posterior stabilization.  Today's x-ray of the chest shows cardiomegaly with  pulmonary vascular congestion and mild edema, small bilateral pleural effusions, minimum right lung base infiltrate versus atelectasis.   ASSESSMENT AND PLAN: We have an 81 year old Caucasian female with chronic obstructive pulmonary disease, moderate-to-severe pulmonary hypertension, history of congestive heart failure but unclear if it is systolic or diastolic, hypertension, history of subdural hematoma while she was on Coumadin, atrial fibrillation, status post pacemaker, who presents with shortness of breath, hypoxia and is found to have evidence for congestive heart failure.   1. Congestive heart failure: At this point, I would admit the patient to telemetry and start her on IV Lasix at 20 mg b.i.d. The patient appears to have acute on chronic hypoxic respiratory failure with oxygen saturations of 74% as noted above. She requires oxygen during the day which is not typical for her. This likely is multifactorial with contribution from congestive heart failure and pulmonary edema.  2. Pulmonary hypertension: It is unclear if this is systolic or diastolic. I would start nitro paste and resume the beta blocker, however, can't give an ACE inhibitor given the mildly elevated creatinine at this point. I would measure ins and outs and daily weights. I would also get an echocardiogram and Cardiology consult. I would trend the troponins. In regards to the pulmonary hypertension, I would also resume her Letairis for that. I would also start the patient on oxygen for now. The patient does have a history of chronic obstructive pulmonary disease but does not have any wheezing and does not appear to have an exacerbation; and, therefore, I would not start steroids at this point but continue her Advair and add p.r.n. nebulizers.  3. Hypertension: I would decrease the dose of HCTZ at this point, given the increased creatinine. I also discussed the patient's need to get the correct dose of metoprolol; but, at this point, I  would start 100 mg b.i.d. I would also continue the calcium channel blocker.  4. Atrial fibrillation: She appears to be rate controlled. I would continue the beta blocker, calcium channel blocker and aspirin. She is not on Coumadin, given the subdural hematoma. The patient also does appear to have anemia. There is no history of gastrointestinal bleeding. I would check iron studies and stool guaiac.  5. Chest pain: I would rule out acute coronary syndrome. She does not have any chest pains now, however. I would cycle the troponins and Cardiology will be consulted. The EKG changes of ST depression on the leads described above were present on previous EKGs as well; and yesterday's EKG, however, was paced and did not see those changes in that EKG. I would continue the aspirin, beta blocker, or calcium channel blocker, and check a lipid profile.  6. Prophylaxis: I would start the patient on TEDs as well as heparin for deep vein thrombosis prophylaxis.  CODE STATUS:  The patient is a FULL CODE.     TIME SPENT:   60 minutes.  ____________________________ Vivien Presto, MD sa:cbb D: 10/02/2011 15:32:49 ET T: 10/02/2011 16:11:52 ET JOB#: [id number]  cc: Vivien Presto, MD, <Dictator> John B. Sarina Ser, MD Karel Jarvis [facility] MD ELECTRONICALLY SIGNED 10/11/2011 16:30

## 2014-09-21 NOTE — Discharge Summary (Signed)
PATIENT NAME:  Brittany Casey, Brittany Casey MR#:  [id number] DATE OF BIRTH:  02/24/1927  DATE OF ADMISSION:  05/15/2014 DATE OF DISCHARGE:  05/18/2014  DISCHARGE DIAGNOSES:  1.  Upper gastrointestinal bleed.  2.  Acute blood loss anemia.  3.  Congestive heart failure.  4.   Chronic atrial fibrillation.  5.  Chronic obstructive pulmonary disease.  6.  Moderate Alzheimer's dementia.  7.  Osteoarthritis.   DISCHARGE MEDICATIONS: Calcium with vitamin D 600 mg daily, multivitamin daily, Aricept 10 mg at bedtime, Estrace vaginal cream one application vaginal Monday, Wednesday, Friday, HCTZ 25 mg daily, Letairis 5 mg daily, sertraline 150 mg daily, potassium chloride 20 mEq daily, diltiazem ER 180 mg daily, and famotidine 20 mg b.i.d.   REASON FOR ADMISSION:  An 81 year old female presents with a GI bleed. Please see H and P for HPI, past medical history and physical exam.   HOSPITAL COURSE: The patient was admitted, transfused 2 units PRBCs. Her hemoglobin came up to 8.4. Upper endoscopy essentially normal. The hemoglobin is stable. She had no more melena. She had some neck pain but x-rays normal due to generalized weakness and the inability to look after herself, she will be placed skilled nursing. She will need to be a low salt diet with her history of congestive heart failure. In the past was on Lasix 80 mg every other day but with a volume depletion that has been held and she will be on HCTZ alone. Holding anti-inflammatories in light of her recent gastrointestinal bleed. Overall prognosis is poor with her dementia and multiple medical problems  ____________________________ Rusty Aus, MD mfm:at D: 05/18/2014 07:51:42 ET T: 05/18/2014 10:10:15 ET JOB#: [id number]  cc: Rusty Aus, MD, <Dictator> MARK Roselee Culver MD ELECTRONICALLY SIGNED 05/29/2014 20:04

## 2014-11-16 IMAGING — CR DG CHEST 1V PORT
1 series · 1 of 1 positions shown · non-contrast
Comparison: 02/03/2014

CLINICAL DATA: Congestive heart failure

EXAM:
PORTABLE CHEST - 1 VIEW

[ap]
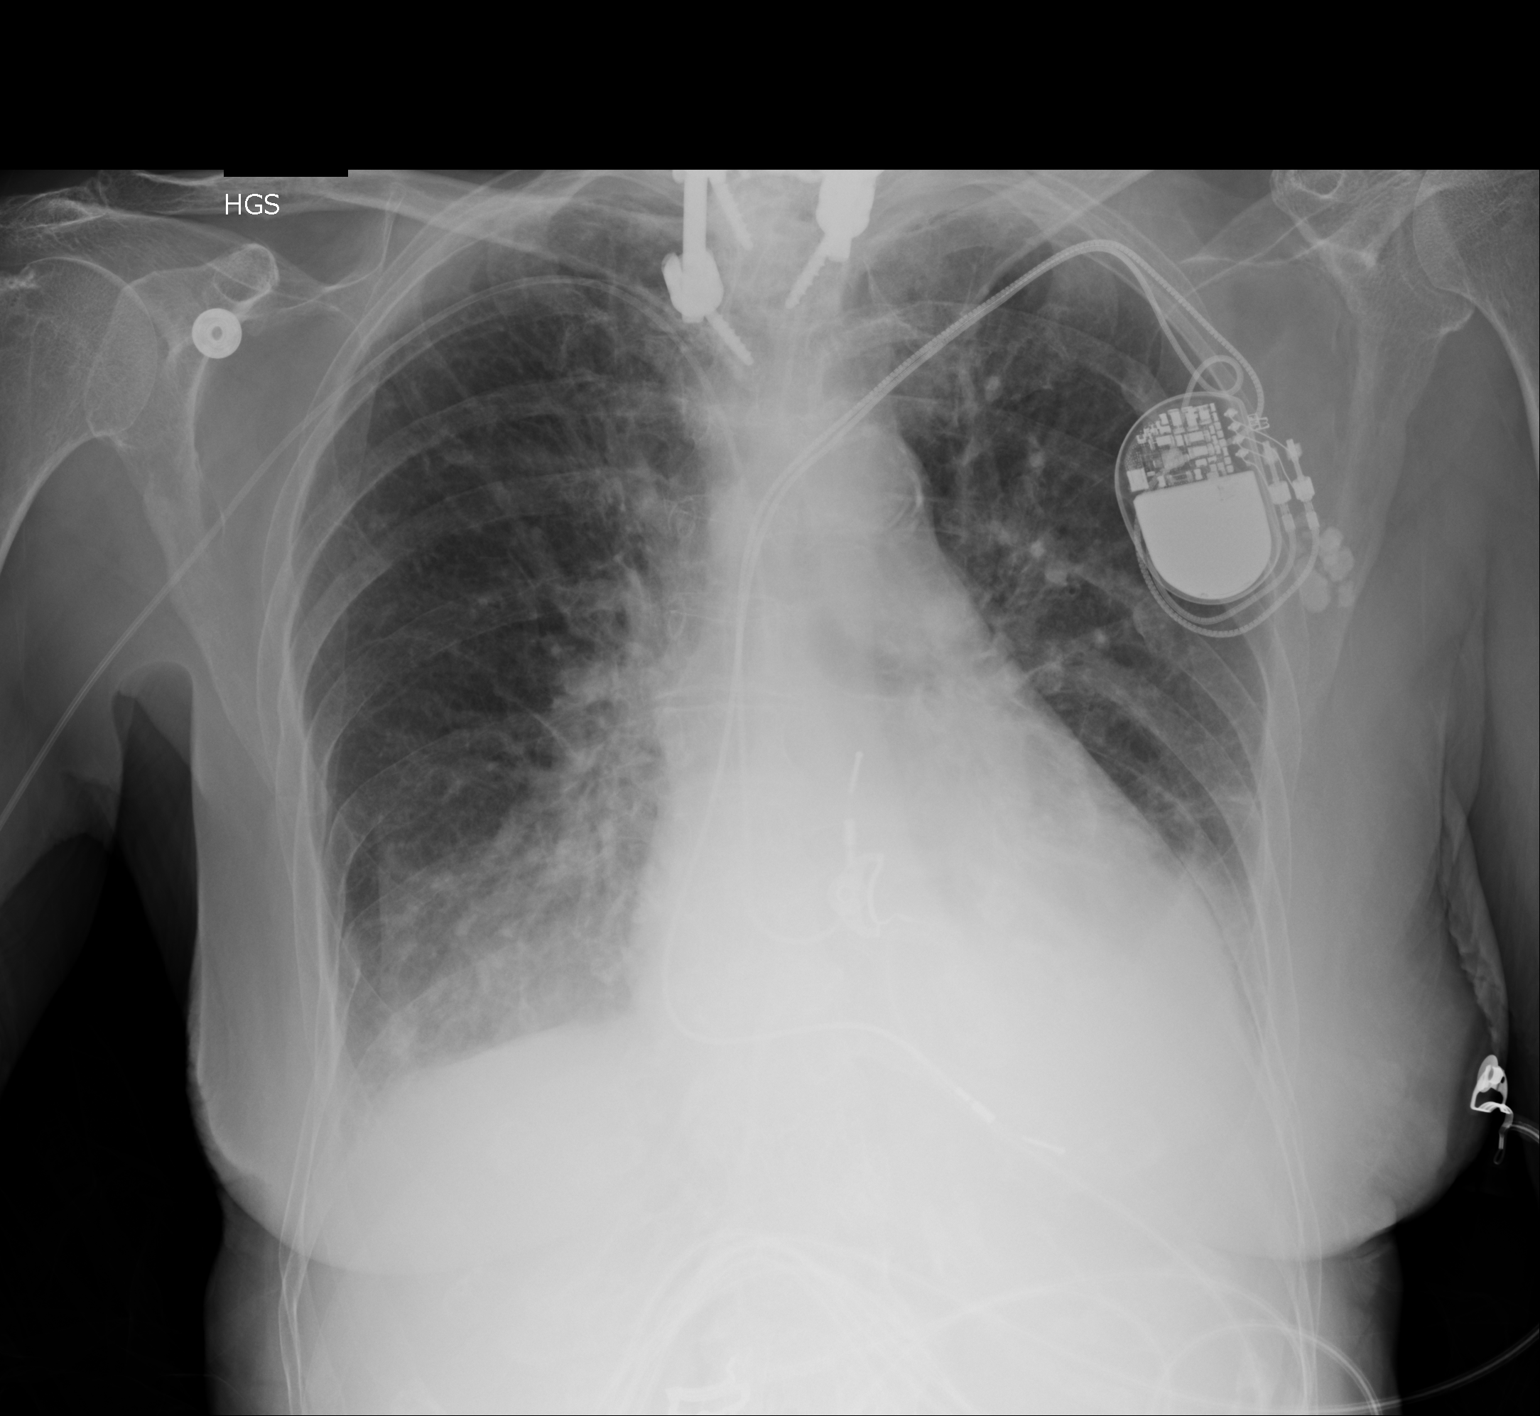

[1 of 1 positions shown; findings below may reference images not displayed]

FINDINGS: Stable PICC line and cardiac pacer. Stable mild cardiac enlargement.
There is mild vascular congestion. There is mild diffuse
interstitial pulmonary edema, left prominent when compared to the
prior study. Perihilar airspace opacity on the right is also
decreased in conspicuity. Increased hazy density at both lung bases
suggests presence of small pleural effusions.
IMPRESSION: Improving congestive heart failure with improved pulmonary edema and
mobilization of fluid to pleural space bilaterally.

## 2014-11-18 ENCOUNTER — Other Ambulatory Visit: Payer: Self-pay | Admitting: Family

## 2014-11-18 DIAGNOSIS — M6740 Ganglion, unspecified site: Secondary | ICD-10-CM

## 2014-12-04 ENCOUNTER — Other Ambulatory Visit: Payer: Self-pay

## 2014-12-04 ENCOUNTER — Emergency Department
Admission: EM | Admit: 2014-12-04 | Discharge: 2014-12-04 | Disposition: A | Discharge status: Home / Self Care | Payer: Medicare (Managed Care) | Attending: Emergency Medicine | Admitting: Emergency Medicine

## 2014-12-04 ENCOUNTER — Emergency Department: Payer: Medicare (Managed Care)

## 2014-12-04 DIAGNOSIS — Z79899 Other long term (current) drug therapy: Secondary | ICD-10-CM | POA: Insufficient documentation

## 2014-12-04 DIAGNOSIS — R079 Chest pain, unspecified: Secondary | ICD-10-CM | POA: Insufficient documentation

## 2014-12-04 DIAGNOSIS — I2510 Atherosclerotic heart disease of native coronary artery without angina pectoris: Secondary | ICD-10-CM | POA: Insufficient documentation

## 2014-12-04 DIAGNOSIS — Z7951 Long term (current) use of inhaled steroids: Secondary | ICD-10-CM | POA: Diagnosis not present

## 2014-12-04 DIAGNOSIS — Z7982 Long term (current) use of aspirin: Secondary | ICD-10-CM | POA: Insufficient documentation

## 2014-12-04 LAB — BASIC METABOLIC PANEL
ANION GAP: 9 (ref 5–15)
BUN: 43 mg/dL — ABNORMAL HIGH (ref 6–20)
CALCIUM: 8.5 mg/dL — AB (ref 8.9–10.3)
CO2: 30 mmol/L (ref 22–32)
Chloride: 100 mmol/L — ABNORMAL LOW (ref 101–111)
Creatinine, Ser: 1.8 mg/dL — ABNORMAL HIGH (ref 0.44–1.00)
GFR calc Af Amer: 28 mL/min — ABNORMAL LOW (ref >60)
GFR calc non Af Amer: 24 mL/min — ABNORMAL LOW (ref >60)
GLUCOSE: 102 mg/dL — AB (ref 65–99)
POTASSIUM: 3.5 mmol/L (ref 3.5–5.1)
SODIUM: 139 mmol/L (ref 135–145)

## 2014-12-04 LAB — URINALYSIS COMPLETE WITH MICROSCOPIC (ARMC ONLY)
BACTERIA UA: NONE SEEN
Bilirubin Urine: NEGATIVE
GLUCOSE, UA: NEGATIVE mg/dL
HGB URINE DIPSTICK: NEGATIVE
Ketones, ur: NEGATIVE mg/dL
LEUKOCYTES UA: NEGATIVE
NITRITE: NEGATIVE
Protein, ur: 30 mg/dL — AB
RBC / HPF: NONE SEEN RBC/hpf (ref 0–5)
Specific Gravity, Urine: 1.014 (ref 1.005–1.030)
pH: 7 (ref 5.0–8.0)

## 2014-12-04 LAB — CBC
HCT: 33.7 % — ABNORMAL LOW (ref 35.0–47.0)
Hemoglobin: 11 g/dL — ABNORMAL LOW (ref 12.0–16.0)
MCH: 30.9 pg (ref 26.0–34.0)
MCHC: 32.6 g/dL (ref 32.0–36.0)
MCV: 94.8 fL (ref 80.0–100.0)
Platelets: 147 10*3/uL — ABNORMAL LOW (ref 150–440)
RBC: 3.56 MIL/uL — AB (ref 3.80–5.20)
RDW: 14.7 % — ABNORMAL HIGH (ref 11.5–14.5)
WBC: 4.2 10*3/uL (ref 3.6–11.0)

## 2014-12-04 LAB — TROPONIN I
TROPONIN I: 0.04 ng/mL — AB (ref <0.031)
Troponin I: 0.04 ng/mL — ABNORMAL HIGH (ref <0.031)

## 2014-12-04 MED ORDER — SODIUM CHLORIDE 0.9 % IV SOLN
1000.0000 mL | Freq: Once | INTRAVENOUS | Status: AC
  Administered 2014-12-04: 1000 mL via INTRAVENOUS

## 2014-12-04 MED ORDER — LORAZEPAM 2 MG/ML IJ SOLN
INTRAMUSCULAR | Status: AC
  Administered 2014-12-04: 0.5 mg
  Filled 2014-12-04: qty 1

## 2014-12-04 NOTE — Discharge Instructions (Signed)

## 2014-12-04 NOTE — ED Notes (Signed)
Pt informed to return if any life threatening symptoms occur.  

## 2014-12-04 NOTE — ED Notes (Signed)
Pt from home via EMS c/o chest pain. Husband woke up this am and pt c/o chest pain and then went back to sleep. Call 911. Pt has hx dementia. Unable to verbalize if having chest pain at this time.

## 2014-12-04 NOTE — ED Provider Notes (Signed)
[facility] Emergency Department Provider Note  ____________________________________________  Time seen: 9:20 AM  I have reviewed the triage vital signs and the nursing notes.   HISTORY  Chief Complaint Chest Pain  History limited secondary to dementia  HPI Brittany Casey is a 81 y.o. female with a history of dementia who reportedly complained of chest pain is morning when she woke up but has not complained of it since then. Patient with a history of A. fib and is on aspirin and metoprolol. Husband reports she typically sleeps 16 hours out of the day which is normal for her. He denies fevers. No cough. No vomiting, no diaphoresis patient has a history of 2 heart attacks in the past greater than 5 years ago     Past Medical History  Diagnosis Date  . Arthritis   . Stroke   . Coronary artery disease     There are no active problems to display for this patient.   Past Surgical History  Procedure Laterality Date  . Cardiac surgery      pace maker   . Brain surgery      subdural hematoma    Current Outpatient Rx  Name  Route  Sig  Dispense  Refill  . ambrisentan (LETAIRIS) 5 MG tablet   Oral   Take 5 mg by mouth every morning.         Marland Kitchen aspirin EC 81 MG tablet   Oral   Take 162 mg by mouth every morning.         . Calcium-Vitamin D 600-200 MG-UNIT per tablet   Oral   Take 1 tablet by mouth daily.         . celecoxib (CELEBREX) 200 MG capsule   Oral   Take 200 mg by mouth daily.         Marland Kitchen diltiazem (CARTIA XT) 180 MG 24 hr capsule   Oral   Take 180 mg by mouth daily.         Marland Kitchen donepezil (ARICEPT) 5 MG tablet   Oral   Take 5 mg by mouth at bedtime.         Marland Kitchen estradiol (ESTRACE) 0.1 MG/GM vaginal cream   Vaginal   Place 1 Applicatorful vaginally every Monday, Wednesday, and Friday.         . Fluticasone-Salmeterol (ADVAIR) 100-50 MCG/DOSE AEPB   Inhalation   Inhale 1 puff into the lungs 2 (two) times daily.          . furosemide (LASIX) 80 MG tablet   Oral   Take 80 mg by mouth every other day.         . hydrochlorothiazide (HYDRODIURIL) 25 MG tablet   Oral   Take 25 mg by mouth every morning.         . metoprolol (LOPRESSOR) 100 MG tablet   Oral   Take 100 mg by mouth 2 (two) times daily.         . Multiple Vitamins-Minerals (MULTIVITAMIN PO)   Oral   Take 1 tablet by mouth every morning.         . polycarbophil (FIBERCON) 625 MG tablet   Oral   Take 625 mg by mouth every morning.         . sertraline (ZOLOFT) 50 MG tablet   Oral   Take 50 mg by mouth every evening.         Marland Kitchen spironolactone (ALDACTONE) 25 MG tablet   Oral  Take 25 mg by mouth every morning.           Allergies Tape  History reviewed. No pertinent family history.  Social History History  Substance Use Topics  . Smoking status: Never Smoker   . Smokeless tobacco: Former Systems developer    Types: Snuff  . Alcohol Use: No    Review of Systems  Constitutional: Negative for fever.   Cardiovascular: Positive for chest pain. Respiratory: Negative for shortness of breath. Gastrointestinal: Negative forvomiting and diarrhea. Genitourinary: Negative for dysuria. Musculoskeletal: Patient denies back pain per her husband Skin: Negative for rash. Neurological: Negative for focal weakness   L5 caveat: Review of systems Limited due to severe dementia obtained with help of husband ____________________________________________   PHYSICAL EXAM:  VITAL SIGNS: ED Triage Vitals  Enc Vitals Group     BP 12/04/14 0857 132/80 mmHg     Pulse --      Resp 12/04/14 0857 16     Temp 12/04/14 0857 97.6 F (36.4 C)     Temp Source 12/04/14 0857 Oral     SpO2 12/04/14 0857 100 %     Weight --      Height 12/04/14 0857 5\' 6"  (1.676 m)     Head Cir --      Peak Flow --      Pain Score --      Pain Loc --      Pain Edu? --      Excl. in [place]? --      Constitutional: Alert and disoriented Eyes: Conjunctivae  are normal.  ENT   Head: Normocephalic and atraumatic.   Mouth/Throat: Mucous membranes are dry Cardiovascular: Normal rate, regular rhythm. Normal and symmetric distal pulses are present in all extremities. No murmurs, rubs, or gallops. Respiratory: Normal respiratory effort without tachypnea nor retractions. Breath sounds are clear and equal bilaterally.  Gastrointestinal: Soft and non-tender in all quadrants. No distention. There is no CVA tenderness. Genitourinary: deferred Musculoskeletal: Nontender with normal range of motion in all extremities. No lower extremity tenderness nor edema. Neurologic:  No gross focal neurologic deficits are appreciated. Skin:  Skin is warm, dry and intact. No rash noted.   ____________________________________________    LABS (pertinent positives/negatives)  Labs Reviewed  CBC - Abnormal; Notable for the following:    RBC 3.56 (*)    Hemoglobin 11.0 (*)    HCT 33.7 (*)    RDW 14.7 (*)    Platelets 147 (*)    All other components within normal limits  TROPONIN I  BASIC METABOLIC PANEL  URINALYSIS COMPLETEWITH MICROSCOPIC ([facility] ONLY)    ____________________________________________   EKG  ED ECG REPORT I, Lavonia Drafts, the attending physician, personally viewed and interpreted this ECG.   Date: 12/04/2014  EKG Time: 8:57 AM  Rate: 93  Rhythm: , Electronic ventricular pacemaker   Axis: Unremarkable  Intervals:nonspecific intraventricular conduction delay  ST&T Change: Nonspecific   ____________________________________________    RADIOLOGY I have personally reviewed any xrays that were ordered on this patient: Chest x-ray shows stable cardiac megaly  ____________________________________________   PROCEDURES  Procedure(s) performed: none  Critical Care performed: none  ____________________________________________   INITIAL IMPRESSION / ASSESSMENT AND PLAN / ED COURSE  Pertinent labs & imaging results that were  available during my care of the patient were reviewed by me and considered in my medical decision making (see chart for details).  Patient with dementia and brief episode of complaining of chest pain. Initial troponin 0.04, this  was rechecked and no change. Labs otherwise unremarkable. Family is comfortable taking her home ____________________________________________   FINAL CLINICAL IMPRESSION(S) / ED DIAGNOSES  Final diagnoses:  Chest pain, unspecified chest pain type     Lavonia Drafts, MD 12/04/14 1542

## 2014-12-04 NOTE — ED Notes (Signed)
ASA 324mg  and 1 nitro spray given PTA by EMS. No pain upon palpation. Denies pain at this time.

## 2014-12-04 NOTE — ED Notes (Signed)
Report given to Heather RN

## 2014-12-29 ENCOUNTER — Ambulatory Visit
Admission: RE | Admit: 2014-12-29 | Discharge: 2014-12-29 | Disposition: A | Discharge status: Home / Self Care | Payer: Medicare (Managed Care) | Source: Ambulatory Visit | Attending: Family | Admitting: Family

## 2014-12-29 DIAGNOSIS — M25831 Other specified joint disorders, right wrist: Secondary | ICD-10-CM | POA: Insufficient documentation

## 2014-12-29 DIAGNOSIS — M6740 Ganglion, unspecified site: Secondary | ICD-10-CM

## 2014-12-29 DIAGNOSIS — Z09 Encounter for follow-up examination after completed treatment for conditions other than malignant neoplasm: Secondary | ICD-10-CM | POA: Insufficient documentation

## 2015-01-20 ENCOUNTER — Inpatient Hospital Stay: Payer: Medicare (Managed Care) | Attending: Internal Medicine

## 2015-01-20 ENCOUNTER — Inpatient Hospital Stay (HOSPITAL_BASED_OUTPATIENT_CLINIC_OR_DEPARTMENT_OTHER): Payer: Medicare (Managed Care) | Admitting: Internal Medicine

## 2015-01-20 ENCOUNTER — Other Ambulatory Visit: Payer: Self-pay

## 2015-01-20 VITALS — BP 105/63 | HR 115 | Temp 97.2°F | Resp 18 | Ht 66.0 in | Wt 115.3 lb

## 2015-01-20 DIAGNOSIS — Z7982 Long term (current) use of aspirin: Secondary | ICD-10-CM | POA: Diagnosis not present

## 2015-01-20 DIAGNOSIS — Z79811 Long term (current) use of aromatase inhibitors: Secondary | ICD-10-CM

## 2015-01-20 DIAGNOSIS — I4891 Unspecified atrial fibrillation: Secondary | ICD-10-CM | POA: Diagnosis not present

## 2015-01-20 DIAGNOSIS — C50912 Malignant neoplasm of unspecified site of left female breast: Secondary | ICD-10-CM

## 2015-01-20 DIAGNOSIS — R531 Weakness: Secondary | ICD-10-CM

## 2015-01-20 DIAGNOSIS — F0390 Unspecified dementia without behavioral disturbance: Secondary | ICD-10-CM | POA: Insufficient documentation

## 2015-01-20 DIAGNOSIS — N189 Chronic kidney disease, unspecified: Secondary | ICD-10-CM

## 2015-01-20 DIAGNOSIS — R5382 Chronic fatigue, unspecified: Secondary | ICD-10-CM | POA: Insufficient documentation

## 2015-01-20 DIAGNOSIS — R740 Nonspecific elevation of levels of transaminase and lactic acid dehydrogenase [LDH]: Secondary | ICD-10-CM | POA: Diagnosis not present

## 2015-01-20 DIAGNOSIS — Z950 Presence of cardiac pacemaker: Secondary | ICD-10-CM | POA: Insufficient documentation

## 2015-01-20 DIAGNOSIS — Z8041 Family history of malignant neoplasm of ovary: Secondary | ICD-10-CM | POA: Diagnosis not present

## 2015-01-20 DIAGNOSIS — Z170 Estrogen receptor positive status [ER+]: Secondary | ICD-10-CM | POA: Insufficient documentation

## 2015-01-20 DIAGNOSIS — D696 Thrombocytopenia, unspecified: Secondary | ICD-10-CM

## 2015-01-20 DIAGNOSIS — K589 Irritable bowel syndrome without diarrhea: Secondary | ICD-10-CM | POA: Insufficient documentation

## 2015-01-20 DIAGNOSIS — R590 Localized enlarged lymph nodes: Secondary | ICD-10-CM | POA: Insufficient documentation

## 2015-01-20 DIAGNOSIS — I2510 Atherosclerotic heart disease of native coronary artery without angina pectoris: Secondary | ICD-10-CM | POA: Diagnosis not present

## 2015-01-20 DIAGNOSIS — Z806 Family history of leukemia: Secondary | ICD-10-CM | POA: Insufficient documentation

## 2015-01-20 DIAGNOSIS — Z79899 Other long term (current) drug therapy: Secondary | ICD-10-CM | POA: Diagnosis not present

## 2015-01-20 DIAGNOSIS — Z8673 Personal history of transient ischemic attack (TIA), and cerebral infarction without residual deficits: Secondary | ICD-10-CM | POA: Insufficient documentation

## 2015-01-20 DIAGNOSIS — I129 Hypertensive chronic kidney disease with stage 1 through stage 4 chronic kidney disease, or unspecified chronic kidney disease: Secondary | ICD-10-CM | POA: Diagnosis not present

## 2015-01-20 DIAGNOSIS — C50512 Malignant neoplasm of lower-outer quadrant of left female breast: Secondary | ICD-10-CM | POA: Diagnosis not present

## 2015-01-20 DIAGNOSIS — D649 Anemia, unspecified: Secondary | ICD-10-CM

## 2015-01-20 DIAGNOSIS — I272 Other secondary pulmonary hypertension: Secondary | ICD-10-CM | POA: Insufficient documentation

## 2015-01-20 DIAGNOSIS — M1990 Unspecified osteoarthritis, unspecified site: Secondary | ICD-10-CM | POA: Insufficient documentation

## 2015-01-20 LAB — CBC WITH DIFFERENTIAL/PLATELET
BASOS PCT: 1 %
Basophils Absolute: 0 10*3/uL (ref 0–0.1)
EOS PCT: 2 %
Eosinophils Absolute: 0.1 10*3/uL (ref 0–0.7)
HCT: 30.2 % — ABNORMAL LOW (ref 35.0–47.0)
HEMOGLOBIN: 10 g/dL — AB (ref 12.0–16.0)
Lymphocytes Relative: 10 %
Lymphs Abs: 0.6 10*3/uL — ABNORMAL LOW (ref 1.0–3.6)
MCH: 30.1 pg (ref 26.0–34.0)
MCHC: 33 g/dL (ref 32.0–36.0)
MCV: 91.4 fL (ref 80.0–100.0)
Monocytes Absolute: 0.7 10*3/uL (ref 0.2–0.9)
Monocytes Relative: 12 %
NEUTROS PCT: 75 %
Neutro Abs: 4.3 10*3/uL (ref 1.4–6.5)
PLATELETS: 160 10*3/uL (ref 150–440)
RBC: 3.3 MIL/uL — AB (ref 3.80–5.20)
RDW: 15.1 % — ABNORMAL HIGH (ref 11.5–14.5)
WBC: 5.7 10*3/uL (ref 3.6–11.0)

## 2015-01-20 LAB — CREATININE, SERUM
CREATININE: 1.96 mg/dL — AB (ref 0.44–1.00)
GFR calc Af Amer: 25 mL/min — ABNORMAL LOW (ref >60)
GFR calc non Af Amer: 22 mL/min — ABNORMAL LOW (ref >60)

## 2015-01-20 LAB — HEPATIC FUNCTION PANEL
ALBUMIN: 3.4 g/dL — AB (ref 3.5–5.0)
ALK PHOS: 67 U/L (ref 38–126)
ALT: 30 U/L (ref 14–54)
AST: 54 U/L — ABNORMAL HIGH (ref 15–41)
BILIRUBIN TOTAL: 0.6 mg/dL (ref 0.3–1.2)
Bilirubin, Direct: <0.1 mg/dL — ABNORMAL LOW (ref 0.1–0.5)
TOTAL PROTEIN: 6.5 g/dL (ref 6.5–8.1)

## 2015-01-23 ENCOUNTER — Other Ambulatory Visit: Payer: Self-pay | Admitting: Geriatric Medicine

## 2015-01-24 ENCOUNTER — Other Ambulatory Visit: Payer: Self-pay | Admitting: Geriatric Medicine

## 2015-01-24 DIAGNOSIS — Z853 Personal history of malignant neoplasm of breast: Secondary | ICD-10-CM

## 2015-01-26 ENCOUNTER — Ambulatory Visit: Payer: Medicare (Managed Care)

## 2015-01-26 ENCOUNTER — Ambulatory Visit
Admission: RE | Admit: 2015-01-26 | Discharge: 2015-01-26 | Disposition: A | Discharge status: Home / Self Care | Payer: Medicare (Managed Care) | Source: Ambulatory Visit | Attending: Geriatric Medicine | Admitting: Geriatric Medicine

## 2015-01-26 ENCOUNTER — Other Ambulatory Visit: Payer: Self-pay | Admitting: Geriatric Medicine

## 2015-01-26 DIAGNOSIS — Z853 Personal history of malignant neoplasm of breast: Secondary | ICD-10-CM

## 2015-01-26 HISTORY — DX: Malignant neoplasm of unspecified site of unspecified female breast: C50.919

## 2015-01-31 ENCOUNTER — Telehealth: Payer: Self-pay | Admitting: *Deleted

## 2015-01-31 ENCOUNTER — Other Ambulatory Visit: Payer: Self-pay | Admitting: Geriatric Medicine

## 2015-01-31 DIAGNOSIS — Z853 Personal history of malignant neoplasm of breast: Secondary | ICD-10-CM

## 2015-01-31 NOTE — Telephone Encounter (Signed)
I called md office earlier today to see if there was an u/s done with mammogram that was done because Dr. Ma Hillock had felt a lymph node on left axilla.  When Dr. Raliegh Ip checked into it the mammography dept did not do the u/s because the mammogram was normal.  She has now ordered u/s stat and wonders if we should cancel the appt on wed. Due to we do  Not have results of us/ yet. I told her yes we will cancel appt and I will send message to staf to cancel it. I asked if she would call us when the u/s is done and we can r/s appt after that time and she will call us to r/s when the u/s complete.

## 2015-02-01 ENCOUNTER — Other Ambulatory Visit: Payer: Self-pay | Admitting: Geriatric Medicine

## 2015-02-01 ENCOUNTER — Ambulatory Visit
Admission: RE | Admit: 2015-02-01 | Discharge: 2015-02-01 | Disposition: A | Discharge status: Home / Self Care | Payer: Medicare (Managed Care) | Source: Ambulatory Visit | Attending: Geriatric Medicine | Admitting: Geriatric Medicine

## 2015-02-01 ENCOUNTER — Inpatient Hospital Stay: Payer: PRIVATE HEALTH INSURANCE | Admitting: Internal Medicine

## 2015-02-01 DIAGNOSIS — R2232 Localized swelling, mass and lump, left upper limb: Secondary | ICD-10-CM | POA: Insufficient documentation

## 2015-02-01 DIAGNOSIS — Z853 Personal history of malignant neoplasm of breast: Secondary | ICD-10-CM | POA: Diagnosis present

## 2015-02-03 ENCOUNTER — Other Ambulatory Visit: Payer: Self-pay | Admitting: Geriatric Medicine

## 2015-02-03 DIAGNOSIS — R599 Enlarged lymph nodes, unspecified: Secondary | ICD-10-CM

## 2015-02-07 NOTE — Progress Notes (Signed)
[place]  [phone] [phone]) [phone]     ID: Brittany Casey OB: Oct 19, 1926  MR#: [id number]  CSN#:[id number]  Patient Care Team: Pcp Not In System as PCP - General  CHIEF COMPLAINT/DIAGNOSIS:  pT1b pN0(sn) cM0, Stage I,  Invasive Mammary Carcinoma of the left lower outer quadrant breast alongwith DCIS, initially diagnosed by US-guided core biopsy on 11/04/13. Then had lumpectomy and SLN study on 12/14/13, with surgical pathology reporting tumor size 1 cm, grade 2, margins negative. Two sentinel lymph nodes plus one left axillary lymph node negative for metastasis.  ER positive (>90%), PR positive (>90%), HER-2/neu negative by FISH (Her2/CEP17 ratio 1.15)   - patient started hormonal therapy with aromatase inhibitor in Sept 2015.    HISTORY OF PRESENT ILLNESS:  Patient returns for continued oncology follow-up, she was seen about 5-6 months ago. States that she has not been taking anastrozole for many months now, did not inform us during her last visit here. States that she was concerned about possible side effects. She continues to have chronic fatigue and weakness which she attributes to age and medical problems, and this is unchanged. She has chronic arthritis which is unchanged, denies any worsening or new bone pains. Denies any recurrent bleeding symptoms including blood in stools, urine, or hematemesis at this time. Appetite is fairly steady. Denies feeling any breast masses on self exam.   REVIEW OF SYSTEMS:   ROS As in HPI above. In addition, no fever, chills. No new headaches or focal weakness. No sore throat or dysphagia. No dizziness or palpitation. No abdominal pain, constipation, diarrhea, dysuria or hematuria. No new skin rash or bleeding symptoms. No new paresthesias in extremities. No polyuria polydipsia.  PAST MEDICAL HISTORY: Reviewed. Past Medical History  Diagnosis Date  . Arthritis   . Stroke   . Coronary artery disease   . Breast cancer  11/04/2013    left          Chronic obstructive asthma  Hypertension  Dementia  CVA 2000  GERD  Sinoatrial node dysfunction  Raynaud's phenomenon  Pulmonary hypertension  Renal insufficiency  Atrial fibrillation  CHF  Osteoarthritis, cervical disc disease  Chronic depression  Irritable bowel syndrome, intermittent diarrhea  CVA at 2000  Cervical disc fusion  Evacuation of subdural hematoma  Cholecystectomy  Pacemaker  Dec 2015 - Hospitalisation for UGI bleed, CHF, COPD.   PAST SURGICAL HISTORY: Reviewed. Past Surgical History  Procedure Laterality Date  . Cardiac surgery      pace maker   . Brain surgery      subdural hematoma    FAMILY HISTORY: Reviewed. Mother had leukemia, sister had ovarian cancer in her 55s.  SOCIAL HISTORY: Reviewed. Social History  Substance Use Topics  . Smoking status: Never Smoker   . Smokeless tobacco: Former Systems developer    Types: Snuff  . Alcohol Use: No    Allergies  Allergen Reactions  . Tape Rash    Current Outpatient Prescriptions  Medication Sig Dispense Refill  . ambrisentan (LETAIRIS) 5 MG tablet Take 5 mg by mouth every morning.    Marland Kitchen anastrozole (ARIMIDEX) 1 MG tablet Take 1 mg by mouth daily.    Marland Kitchen aspirin EC 81 MG tablet Take 162 mg by mouth every morning.    . Calcium-Vitamin D 600-200 MG-UNIT per tablet Take 1 tablet by mouth daily.    . celecoxib (CELEBREX) 200 MG capsule Take 200 mg by mouth daily.    Marland Kitchen diltiazem (CARTIA XT) 180 MG  24 hr capsule Take 180 mg by mouth daily.    Marland Kitchen estradiol (ESTRACE) 0.1 MG/GM vaginal cream Place 1 Applicatorful vaginally every Monday, Wednesday, and Friday.    . Fluticasone-Salmeterol (ADVAIR) 100-50 MCG/DOSE AEPB Inhale 1 puff into the lungs 2 (two) times daily.    . furosemide (LASIX) 80 MG tablet Take 80 mg by mouth every other day.    . hydrochlorothiazide (HYDRODIURIL) 25 MG tablet Take 25 mg by mouth every morning.    . metoprolol (LOPRESSOR) 100 MG tablet Take 100 mg by mouth 2 (two)  times daily.    . Multiple Vitamins-Minerals (MULTIVITAMIN PO) Take 1 tablet by mouth every morning.    . polycarbophil (FIBERCON) 625 MG tablet Take 625 mg by mouth every morning.    . sertraline (ZOLOFT) 50 MG tablet Take 50 mg by mouth every evening.    Marland Kitchen spironolactone (ALDACTONE) 25 MG tablet Take 25 mg by mouth every morning.    . donepezil (ARICEPT) 5 MG tablet Take 5 mg by mouth at bedtime.     No current facility-administered medications for this visit.    PHYSICAL EXAM: Filed Vitals:   01/20/15 1120  BP: 105/63  Pulse: 115  Temp: 97.2 F (36.2 C)  Resp: 18     Body mass index is 18.62 kg/(m^2).    ECOG FS:2 - Symptomatic, <50% confined to bed  GENERAL: Patient is alert and oriented and in no acute distress. There is no icterus. HEENT: EOMs intact. Oral exam negative for thrush or lesions. No cervical lymphadenopathy. CVS: S1S2, regular LUNGS: Bilaterally clear to auscultation, no rhonchi. ABDOMEN: Soft, nontender. No hepatomegaly clinically.  EXTREMITIES: No pedal edema. BREASTS: No dominant masses on either side. Small left axillary lymph node 2 cm, mobile and nontender. Exam performed in presence of a nurse.   LAB RESULTS:    Component Value Date/Time   NA 139 12/04/2014 0913   NA 144 05/18/2014 0436   K 3.5 12/04/2014 0913   K 3.3* 05/18/2014 0436   CL 100* 12/04/2014 0913   CL 112* 05/18/2014 0436   CO2 30 12/04/2014 0913   CO2 26 05/18/2014 0436   GLUCOSE 102* 12/04/2014 0913   GLUCOSE 112* 05/18/2014 0436   BUN 43* 12/04/2014 0913   BUN 29* 05/18/2014 0436   CREATININE 1.96* 01/20/2015 1108   CREATININE 1.41* 05/18/2014 0436   CALCIUM 8.5* 12/04/2014 0913   CALCIUM 8.1* 05/18/2014 0436   PROT 6.5 01/20/2015 1108   PROT 6.0* 05/17/2014 0420   ALBUMIN 3.4* 01/20/2015 1108   ALBUMIN 2.7* 05/17/2014 0420   AST 54* 01/20/2015 1108   AST 50* 05/17/2014 0420   ALT 30 01/20/2015 1108   ALT 32 05/17/2014 0420   ALKPHOS 67 01/20/2015 1108   ALKPHOS 70  05/17/2014 0420   BILITOT 0.6 01/20/2015 1108   BILITOT 0.8 05/17/2014 0420   GFRNONAA 22* 01/20/2015 1108   GFRNONAA 38* 05/18/2014 0436   GFRNONAA 33* 02/15/2014 1514   GFRAA 25* 01/20/2015 1108   GFRAA 46* 05/18/2014 0436   GFRAA 39* 02/15/2014 1514    Lab Results  Component Value Date   WBC 5.7 01/20/2015   NEUTROABS 4.3 01/20/2015   HGB 10.0* 01/20/2015   HCT 30.2* 01/20/2015   MCV 91.4 01/20/2015   PLT 160 01/20/2015     STUDIES: 11/04/13 - Left breast Mass biopsy. Diagnosis: LEFT BREAST: INVASIVE MAMMARY CARCINOMA WITH CRIBRIFORM FEATURES, SEE COMMENT. DUCTAL CARCINOMA IN SITU, LOW NUCLEAR GRADE, CRIBRIFORM TYPE. Comment: The largest linear focus  of invasive carcinoma measures 4 mm in this material. Although the final grade should be assigned on the excision specimen, the findings correspond to a Nottingham grade of 1 (tubule formation score 1, nuclear pleomorphism score 2, mitotic count score 1, total score 4/9). ER/PR will be assessed by immunohistochemistry and Her 2 FISH will be performed. Results will be reported separately. Intradepartmental consultation was obtained.     ASSESSMENT / PLAN:   1.  pT1b pN0(sn) cM0, Stage I,  Invasive Mammary Carcinoma of the left lower outer quadrant breast alongwith DCIS, initially diagnosed by US-guided core biopsy on 11/04/13.Then hadlumpectomy and SLN study on 12/14/13, with surgical pathology reporting tumor size 1 cm, grade 2, margins negative. Two sentinel lymph nodes plus one left axillary lymph node negative for metastasis. ER positive (>90%), PR positive (>90%), HER-2/neu negative by FISH (Her2/CEP17 ratio 1.15). She has completed breast radiation. Started hormonal therapy with aromatase inhibitor in Sept 2015   -   reviewed labs from today and d/w patient. She has not been taking anastrozole since many months now although she did not inform us about this on her last visit here in March. Patient is agreeable to resume anastrozole. At  this time, she does have a lymph node palpable in the left axilla on clinical exam today and needs further evaluation with mammogram/ultrasound, we will request PACE to get this done. Will see her back on September 7 and make further plan of management based upon workup.  2. Anemia and thrombocytopenia - Hemoglobin stable at 10.0 today, much better than 6.4 in December when she had upper GI bleed. She also has evidence of chronic renal insufficiency and this might be contributing to the mild anemia. Patient states that she visits with primary physician once every 3 to 4 months and gets close monitoring there. Platelet count also is improved into the low normal range.  3. Mildly elevated liver transaminases - chronic, unchanged.  4. In between visits, patient advised to call or come to ER in case of any new symptoms or acute sickness. She is agreeable to this plan.    Leia Alf, MD   02/07/2015 5:20 PM

## 2015-02-10 ENCOUNTER — Other Ambulatory Visit: Payer: Self-pay | Admitting: Geriatric Medicine

## 2015-02-10 ENCOUNTER — Ambulatory Visit
Admission: RE | Admit: 2015-02-10 | Discharge: 2015-02-10 | Disposition: A | Discharge status: Home / Self Care | Payer: Medicare (Managed Care) | Source: Ambulatory Visit | Attending: Geriatric Medicine | Admitting: Geriatric Medicine

## 2015-02-10 DIAGNOSIS — J90 Pleural effusion, not elsewhere classified: Secondary | ICD-10-CM | POA: Insufficient documentation

## 2015-02-10 DIAGNOSIS — Z853 Personal history of malignant neoplasm of breast: Secondary | ICD-10-CM | POA: Insufficient documentation

## 2015-02-10 DIAGNOSIS — I517 Cardiomegaly: Secondary | ICD-10-CM | POA: Insufficient documentation

## 2015-02-10 DIAGNOSIS — R599 Enlarged lymph nodes, unspecified: Secondary | ICD-10-CM

## 2015-02-10 DIAGNOSIS — I700 Atherosclerosis of aorta: Secondary | ICD-10-CM | POA: Diagnosis not present

## 2015-02-10 DIAGNOSIS — I2510 Atherosclerotic heart disease of native coronary artery without angina pectoris: Secondary | ICD-10-CM | POA: Insufficient documentation

## 2015-02-10 DIAGNOSIS — M5134 Other intervertebral disc degeneration, thoracic region: Secondary | ICD-10-CM | POA: Diagnosis not present

## 2015-02-15 ENCOUNTER — Inpatient Hospital Stay: Payer: Medicare (Managed Care) | Attending: Internal Medicine | Admitting: Internal Medicine

## 2015-02-15 VITALS — BP 134/73 | HR 93 | Wt 115.7 lb

## 2015-02-15 DIAGNOSIS — Z950 Presence of cardiac pacemaker: Secondary | ICD-10-CM | POA: Diagnosis not present

## 2015-02-15 DIAGNOSIS — C50512 Malignant neoplasm of lower-outer quadrant of left female breast: Secondary | ICD-10-CM | POA: Diagnosis present

## 2015-02-15 DIAGNOSIS — I7300 Raynaud's syndrome without gangrene: Secondary | ICD-10-CM | POA: Insufficient documentation

## 2015-02-15 DIAGNOSIS — F329 Major depressive disorder, single episode, unspecified: Secondary | ICD-10-CM | POA: Insufficient documentation

## 2015-02-15 DIAGNOSIS — Z8673 Personal history of transient ischemic attack (TIA), and cerebral infarction without residual deficits: Secondary | ICD-10-CM | POA: Diagnosis not present

## 2015-02-15 DIAGNOSIS — Z170 Estrogen receptor positive status [ER+]: Secondary | ICD-10-CM | POA: Diagnosis not present

## 2015-02-15 DIAGNOSIS — Z79899 Other long term (current) drug therapy: Secondary | ICD-10-CM | POA: Diagnosis not present

## 2015-02-15 DIAGNOSIS — I129 Hypertensive chronic kidney disease with stage 1 through stage 4 chronic kidney disease, or unspecified chronic kidney disease: Secondary | ICD-10-CM | POA: Diagnosis not present

## 2015-02-15 DIAGNOSIS — D649 Anemia, unspecified: Secondary | ICD-10-CM | POA: Diagnosis not present

## 2015-02-15 DIAGNOSIS — Z806 Family history of leukemia: Secondary | ICD-10-CM | POA: Diagnosis not present

## 2015-02-15 DIAGNOSIS — K219 Gastro-esophageal reflux disease without esophagitis: Secondary | ICD-10-CM | POA: Diagnosis not present

## 2015-02-15 DIAGNOSIS — I272 Other secondary pulmonary hypertension: Secondary | ICD-10-CM | POA: Diagnosis not present

## 2015-02-15 DIAGNOSIS — Z8719 Personal history of other diseases of the digestive system: Secondary | ICD-10-CM | POA: Insufficient documentation

## 2015-02-15 DIAGNOSIS — R531 Weakness: Secondary | ICD-10-CM | POA: Insufficient documentation

## 2015-02-15 DIAGNOSIS — Z9223 Personal history of estrogen therapy: Secondary | ICD-10-CM | POA: Diagnosis not present

## 2015-02-15 DIAGNOSIS — I2510 Atherosclerotic heart disease of native coronary artery without angina pectoris: Secondary | ICD-10-CM | POA: Insufficient documentation

## 2015-02-15 DIAGNOSIS — N189 Chronic kidney disease, unspecified: Secondary | ICD-10-CM | POA: Insufficient documentation

## 2015-02-15 DIAGNOSIS — Z7982 Long term (current) use of aspirin: Secondary | ICD-10-CM | POA: Diagnosis not present

## 2015-02-15 DIAGNOSIS — I4891 Unspecified atrial fibrillation: Secondary | ICD-10-CM | POA: Diagnosis not present

## 2015-02-15 DIAGNOSIS — D696 Thrombocytopenia, unspecified: Secondary | ICD-10-CM | POA: Diagnosis not present

## 2015-02-15 DIAGNOSIS — R5382 Chronic fatigue, unspecified: Secondary | ICD-10-CM | POA: Insufficient documentation

## 2015-02-15 DIAGNOSIS — C50912 Malignant neoplasm of unspecified site of left female breast: Secondary | ICD-10-CM

## 2015-02-15 NOTE — Progress Notes (Signed)
Patient is here for follow up of CT.  Patient has dementia and is not an accurate historian

## 2015-03-03 NOTE — Progress Notes (Signed)
[place]  [phone] [phone]) [phone]     ID: Brittany Casey OB: [birth year]  MR#: [id number]  CSN#:[id number]  Patient Care Team: Pcp Not In System as PCP - General  CHIEF COMPLAINT/DIAGNOSIS:  pT1b pN0(sn) cM0, Stage I,  Invasive Mammary Carcinoma of the left lower outer quadrant breast alongwith DCIS, initially diagnosed by US-guided core biopsy on 11/04/13. Then had lumpectomy and SLN study on 12/14/13, with surgical pathology reporting tumor size 1 cm, grade 2, margins negative. Two sentinel lymph nodes plus one left axillary lymph node negative for metastasis.  ER positive (>90%), PR positive (>90%), HER-2/neu negative by FISH (Her2/CEP17 ratio 1.15)   - patient started hormonal therapy with aromatase inhibitor in Sept 2015, discontinued after taking for short period of time.    HISTORY OF PRESENT ILLNESS:  Patient returns for continued oncology follow-up, states that she has made a final decision not to take any kind of hormonal therapy for breast cancer, and that her husband also agrees with her decision. She continues to have chronic fatigue and weakness which she attributes to age and medical problems, and this is unchanged. She has chronic arthritis, denies any new bone pains. Denies feeling any breast masses on self exam.   REVIEW OF SYSTEMS:   ROS As in HPI above. In addition, no fever, chills. No new headaches or focal weakness. No abdominal pain, constipation, diarrhea, dysuria or hematuria. No new skin rash.  PAST MEDICAL HISTORY: Reviewed. Past Medical History  Diagnosis Date  . Arthritis   . Stroke   . Coronary artery disease   . Breast cancer 11/04/2013    left          Chronic obstructive asthma  Hypertension  Dementia  CVA 2000  GERD  Sinoatrial node dysfunction  Raynaud's phenomenon  Pulmonary hypertension  Renal insufficiency  Atrial fibrillation  CHF  Osteoarthritis, cervical disc disease  Chronic depression  Irritable  bowel syndrome, intermittent diarrhea  CVA at 2000  Cervical disc fusion  Evacuation of subdural hematoma  Cholecystectomy  Pacemaker  Dec 2015 - Hospitalisation for UGI bleed, CHF, COPD.   PAST SURGICAL HISTORY: Reviewed. Past Surgical History  Procedure Laterality Date  . Cardiac surgery      pace maker   . Brain surgery      subdural hematoma    FAMILY HISTORY: Reviewed. Mother had leukemia, sister had ovarian cancer in her 70s.  SOCIAL HISTORY: Reviewed. Social History  Substance Use Topics  . Smoking status: Never Smoker   . Smokeless tobacco: Former Systems developer    Types: Snuff  . Alcohol Use: No    Allergies  Allergen Reactions  . Tape Rash    Current Outpatient Prescriptions  Medication Sig Dispense Refill  . ambrisentan (LETAIRIS) 5 MG tablet Take 5 mg by mouth every morning.    Marland Kitchen anastrozole (ARIMIDEX) 1 MG tablet Take 1 mg by mouth daily.    Marland Kitchen aspirin EC 81 MG tablet Take 162 mg by mouth every morning.    . Calcium-Vitamin D 600-200 MG-UNIT per tablet Take 1 tablet by mouth daily.    . celecoxib (CELEBREX) 200 MG capsule Take 200 mg by mouth daily.    Marland Kitchen diltiazem (CARTIA XT) 180 MG 24 hr capsule Take 180 mg by mouth daily.    Marland Kitchen donepezil (ARICEPT) 5 MG tablet Take 5 mg by mouth at bedtime.    . furosemide (LASIX) 80 MG tablet Take 80 mg by mouth every other day.    Marland Kitchen  hydrochlorothiazide (HYDRODIURIL) 25 MG tablet Take 25 mg by mouth every morning.    . metoprolol (LOPRESSOR) 100 MG tablet Take 100 mg by mouth 2 (two) times daily.    . Multiple Vitamins-Minerals (MULTIVITAMIN PO) Take 1 tablet by mouth every morning.    . potassium chloride (K-DUR) 10 MEQ tablet 20 mEq daily.    . sertraline (ZOLOFT) 50 MG tablet Take 50 mg by mouth every evening.    Marland Kitchen spironolactone (ALDACTONE) 25 MG tablet Take 25 mg by mouth every morning.    Marland Kitchen estradiol (ESTRACE) 0.1 MG/GM vaginal cream Place 1 Applicatorful vaginally every Monday, Wednesday, and Friday.     No current  facility-administered medications for this visit.    PHYSICAL EXAM: Filed Vitals:   02/15/15 1400  BP: 134/73  Pulse: 93     Body mass index is 18.69 kg/(m^2).    ECOG FS:2 - Symptomatic, <50% confined to bed  GENERAL: Patient is alert and oriented and in no acute distress. There is no icterus. LUNGS: Bilaterally clear to auscultation, no rhonchi. ABDOMEN: Soft, nontender.   EXTREMITIES: No pedal edema.   LAB RESULTS:    Component Value Date/Time   NA 139 12/04/2014 0913   NA 144 05/18/2014 0436   K 3.5 12/04/2014 0913   K 3.3* 05/18/2014 0436   CL 100* 12/04/2014 0913   CL 112* 05/18/2014 0436   CO2 30 12/04/2014 0913   CO2 26 05/18/2014 0436   GLUCOSE 102* 12/04/2014 0913   GLUCOSE 112* 05/18/2014 0436   BUN 43* 12/04/2014 0913   BUN 29* 05/18/2014 0436   CREATININE 1.96* 01/20/2015 1108   CREATININE 1.41* 05/18/2014 0436   CALCIUM 8.5* 12/04/2014 0913   CALCIUM 8.1* 05/18/2014 0436   PROT 6.5 01/20/2015 1108   PROT 6.0* 05/17/2014 0420   ALBUMIN 3.4* 01/20/2015 1108   ALBUMIN 2.7* 05/17/2014 0420   AST 54* 01/20/2015 1108   AST 50* 05/17/2014 0420   ALT 30 01/20/2015 1108   ALT 32 05/17/2014 0420   ALKPHOS 67 01/20/2015 1108   ALKPHOS 70 05/17/2014 0420   BILITOT 0.6 01/20/2015 1108   BILITOT 0.8 05/17/2014 0420   GFRNONAA 22* 01/20/2015 1108   GFRNONAA 38* 05/18/2014 0436   GFRNONAA 33* 02/15/2014 1514   GFRAA 25* 01/20/2015 1108   GFRAA 46* 05/18/2014 0436   GFRAA 39* 02/15/2014 1514    Lab Results  Component Value Date   WBC 5.7 01/20/2015   NEUTROABS 4.3 01/20/2015   HGB 10.0* 01/20/2015   HCT 30.2* 01/20/2015   MCV 91.4 01/20/2015   PLT 160 01/20/2015     STUDIES: 11/04/13 - Left breast Mass biopsy. Diagnosis: LEFT BREAST: INVASIVE MAMMARY CARCINOMA WITH CRIBRIFORM FEATURES, SEE COMMENT. DUCTAL CARCINOMA IN SITU, LOW NUCLEAR GRADE, CRIBRIFORM TYPE. Comment: The largest linear focus of invasive carcinoma measures 4 mm in this material.  Although the final grade should be assigned on the excision specimen, the findings correspond to a Nottingham grade of 1 (tubule formation score 1, nuclear pleomorphism score 2, mitotic count score 1, total score 4/9). ER/PR will be assessed by immunohistochemistry and Her 2 FISH will be performed. Results will be reported separately. Intradepartmental consultation was obtained.     ASSESSMENT / PLAN:   1.  pT1b pN0(sn) cM0, Stage I,  Invasive Mammary Carcinoma of the left lower outer quadrant breast alongwith DCIS, initially diagnosed by US-guided core biopsy on 11/04/13.Then hadlumpectomy and SLN study on 12/14/13, with surgical pathology reporting tumor size 1 cm, grade 2,  margins negative. Two sentinel lymph nodes plus one left axillary lymph node negative for metastasis. ER positive (>90%), PR positive (>90%), HER-2/neu negative by FISH (Her2/CEP17 ratio 1.15). She has completed breast radiation. Started hormonal therapy with aromatase inhibitor in Sept 2015   -   patient states that she has made a final decision not to pursue any kind of hormonal therapy, I have again explained different options available along with overall benefits and possible side effects. States that she understands the risks involved and losing benefit of not taking hormonal therapy. Will see her back in 4 months with labs for continued surveillance.  2. Anemia and thrombocytopenia - history of upper GI bleed, also has evidence of chronic renal insufficiency and this might be contributing to the mild anemia. Patient states that she visits with primary physician once every 3 to 4 months and gets close monitoring there. Platelet count also is improved into the low normal range.  3. In between visits, patient advised to call or come to ER in case of any new symptoms or acute sickness. She is agreeable to this plan.   Leia Alf, MD   03/03/2015 11:53 PM

## 2015-04-12 ENCOUNTER — Encounter: Payer: Self-pay | Admitting: Emergency Medicine

## 2015-04-12 ENCOUNTER — Emergency Department: Payer: Medicare (Managed Care)

## 2015-04-12 ENCOUNTER — Emergency Department
Admission: EM | Admit: 2015-04-12 | Discharge: 2015-04-12 | Disposition: A | Discharge status: Home / Self Care | Payer: Medicare (Managed Care) | Attending: Emergency Medicine | Admitting: Emergency Medicine

## 2015-04-12 DIAGNOSIS — Z79899 Other long term (current) drug therapy: Secondary | ICD-10-CM | POA: Insufficient documentation

## 2015-04-12 DIAGNOSIS — W19XXXA Unspecified fall, initial encounter: Secondary | ICD-10-CM

## 2015-04-12 DIAGNOSIS — Y9389 Activity, other specified: Secondary | ICD-10-CM | POA: Insufficient documentation

## 2015-04-12 DIAGNOSIS — S42002A Fracture of unspecified part of left clavicle, initial encounter for closed fracture: Secondary | ICD-10-CM | POA: Insufficient documentation

## 2015-04-12 DIAGNOSIS — Y92129 Unspecified place in nursing home as the place of occurrence of the external cause: Secondary | ICD-10-CM | POA: Insufficient documentation

## 2015-04-12 DIAGNOSIS — S2232XA Fracture of one rib, left side, initial encounter for closed fracture: Secondary | ICD-10-CM | POA: Diagnosis not present

## 2015-04-12 DIAGNOSIS — S7002XA Contusion of left hip, initial encounter: Secondary | ICD-10-CM | POA: Insufficient documentation

## 2015-04-12 DIAGNOSIS — S42032A Displaced fracture of lateral end of left clavicle, initial encounter for closed fracture: Secondary | ICD-10-CM

## 2015-04-12 DIAGNOSIS — Z792 Long term (current) use of antibiotics: Secondary | ICD-10-CM | POA: Diagnosis not present

## 2015-04-12 DIAGNOSIS — Y998 Other external cause status: Secondary | ICD-10-CM | POA: Diagnosis not present

## 2015-04-12 DIAGNOSIS — W01198A Fall on same level from slipping, tripping and stumbling with subsequent striking against other object, initial encounter: Secondary | ICD-10-CM | POA: Insufficient documentation

## 2015-04-12 DIAGNOSIS — Z7982 Long term (current) use of aspirin: Secondary | ICD-10-CM | POA: Diagnosis not present

## 2015-04-12 DIAGNOSIS — S40012A Contusion of left shoulder, initial encounter: Secondary | ICD-10-CM | POA: Diagnosis not present

## 2015-04-12 DIAGNOSIS — S4992XA Unspecified injury of left shoulder and upper arm, initial encounter: Secondary | ICD-10-CM | POA: Diagnosis present

## 2015-04-12 HISTORY — DX: Unspecified atrial fibrillation: I48.91

## 2015-04-12 HISTORY — DX: Unspecified dementia, unspecified severity, without behavioral disturbance, psychotic disturbance, mood disturbance, and anxiety: F03.90

## 2015-04-12 HISTORY — DX: Pulmonary hypertension, unspecified: I27.20

## 2015-04-12 HISTORY — DX: Traumatic subdural hemorrhage with loss of consciousness of unspecified duration, initial encounter: S06.5X9A

## 2015-04-12 HISTORY — DX: Traumatic subdural hemorrhage with loss of consciousness status unknown, initial encounter: S06.5XAA

## 2015-04-12 HISTORY — DX: Chronic obstructive pulmonary disease, unspecified: J44.9

## 2015-04-12 LAB — COMPREHENSIVE METABOLIC PANEL
ALT: 39 U/L (ref 14–54)
AST: 65 U/L — ABNORMAL HIGH (ref 15–41)
Albumin: 3.6 g/dL (ref 3.5–5.0)
Alkaline Phosphatase: 96 U/L (ref 38–126)
Anion gap: 9 (ref 5–15)
BILIRUBIN TOTAL: 1 mg/dL (ref 0.3–1.2)
BUN: 33 mg/dL — ABNORMAL HIGH (ref 6–20)
CHLORIDE: 107 mmol/L (ref 101–111)
CO2: 26 mmol/L (ref 22–32)
CREATININE: 1.47 mg/dL — AB (ref 0.44–1.00)
Calcium: 9 mg/dL (ref 8.9–10.3)
GFR, EST AFRICAN AMERICAN: 36 mL/min — AB (ref >60)
GFR, EST NON AFRICAN AMERICAN: 31 mL/min — AB (ref >60)
Glucose, Bld: 119 mg/dL — ABNORMAL HIGH (ref 65–99)
Potassium: 4.1 mmol/L (ref 3.5–5.1)
Sodium: 142 mmol/L (ref 135–145)
TOTAL PROTEIN: 7.6 g/dL (ref 6.5–8.1)

## 2015-04-12 LAB — URINALYSIS COMPLETE WITH MICROSCOPIC (ARMC ONLY)
BILIRUBIN URINE: NEGATIVE
Glucose, UA: NEGATIVE mg/dL
HGB URINE DIPSTICK: NEGATIVE
KETONES UR: NEGATIVE mg/dL
Leukocytes, UA: NEGATIVE
NITRITE: NEGATIVE
PH: 5 (ref 5.0–8.0)
Protein, ur: NEGATIVE mg/dL
Specific Gravity, Urine: 1.013 (ref 1.005–1.030)

## 2015-04-12 LAB — CBC
HCT: 36.6 % (ref 35.0–47.0)
Hemoglobin: 12 g/dL (ref 12.0–16.0)
MCH: 29.8 pg (ref 26.0–34.0)
MCHC: 32.7 g/dL (ref 32.0–36.0)
MCV: 91.1 fL (ref 80.0–100.0)
PLATELETS: 165 10*3/uL (ref 150–440)
RBC: 4.01 MIL/uL (ref 3.80–5.20)
RDW: 17.8 % — AB (ref 11.5–14.5)
WBC: 9.3 10*3/uL (ref 3.6–11.0)

## 2015-04-12 LAB — TROPONIN I: TROPONIN I: 0.04 ng/mL — AB (ref <0.031)

## 2015-04-12 MED ORDER — TRAMADOL HCL 50 MG PO TABS: 50.0000 mg | ORAL_TABLET | Freq: Four times a day (QID) | ORAL | Status: AC | PRN

## 2015-04-12 MED ORDER — OXYCODONE-ACETAMINOPHEN 5-325 MG PO TABS
1.0000 | ORAL_TABLET | Freq: Once | ORAL | Status: AC
  Filled 2015-04-12: qty 1

## 2015-04-12 NOTE — ED Notes (Signed)
Lab called troponin of 0.04.  Notified Dr Dahlia Client.

## 2015-04-12 NOTE — Discharge Instructions (Signed)
Clavicle Fracture The clavicle, also called the collarbone, is the long bone that connects your shoulder to your rib cage. You can feel your collarbone at the top of your shoulders and rib cage. A clavicle fracture is a broken clavicle. It is a common injury that can happen at any age.  CAUSES Common causes of a clavicle fracture include:  A direct blow to your shoulder.  A car accident.  A fall, especially if you try to break your fall with an outstretched arm. RISK FACTORS You may be at increased risk if:  You are younger than 25 years or older than 53 years. Most clavicle fractures happen to people who are younger than 25 years.  You are a female.  You play contact sports. SIGNS AND SYMPTOMS A fractured clavicle is painful. It also makes it hard to move your arm. Other signs and symptoms may include:  A shoulder that drops downward and forward.  Pain when trying to lift your shoulder.  Bruising, swelling, and tenderness over your clavicle.  A grinding noise when you try to move your shoulder.  A bump over your clavicle. DIAGNOSIS Your health care provider can usually diagnose a clavicle fracture by asking about your injury and examining your shoulder and clavicle. He or she may take an X-ray to determine the position of your clavicle. TREATMENT Treatment depends on the position of your clavicle after the fracture:  If the broken ends of the bone are not out of place, your health care provider may put your arm in a sling or wrap a support bandage around your chest (figure-of-eight wrap).  If the broken ends of the bone are out of place, you may need surgery. Surgery may involve placing screws, pins, or plates to keep your clavicle stable while it heals. Healing may take about 3 months. When your health care provider thinks your fracture has healed enough, you may have to do physical therapy to regain normal movement and build up your arm strength. HOME CARE INSTRUCTIONS    Apply ice to the injured area:  Put ice in a plastic bag.  Place a towel between your skin and the bag.  Leave the ice on for 20 minutes, 2-3 times a day.  If you have a wrap or splint:  Wear it all the time, and remove it only to take a bath or shower.  When you bathe or shower, keep your shoulder in the same position as when the sling or wrap is on.  Do not lift your arm.  If you have a figure-of-eight wrap:  Another person must tighten it every day.  It should be tight enough to hold your shoulders back.  Allow enough room to place your index finger between your body and the strap.  Loosen the wrap immediately if you feel numbness or tingling in your hands.  Only take medicines as directed by your health care provider.  Avoid activities that make the injury or pain worse for 4-6 weeks after surgery.  Keep all follow-up appointments. SEEK MEDICAL CARE IF:  Your medicine is not helping to relieve pain and swelling. SEEK IMMEDIATE MEDICAL CARE IF:  Your arm is numb, cold, or pale, even when the splint is loose. MAKE SURE YOU:   Understand these instructions.  Will watch your condition.  Will get help right away if you are not doing well or get worse.   This information is not intended to replace advice given to you by your health care provider.  Make sure you discuss any questions you have with your health care provider.   Document Released: 02/20/2005 Document Revised: 05/18/2013 Document Reviewed: 04/05/2013 Elsevier Interactive Patient Education 2016 [place].  Rib Fracture A rib fracture is a break or crack in one of the bones of the ribs. The ribs are a group of long, curved bones that wrap around your chest and attach to your spine. They protect your lungs and other organs in the chest cavity. A broken or cracked rib is often painful, but most do not cause other problems. Most rib fractures heal on their own over time. However, rib fractures can be more  serious if multiple ribs are broken or if broken ribs move out of place and push against other structures. CAUSES   A direct blow to the chest. For example, this could happen during contact sports, a car accident, or a fall against a hard object.  Repetitive movements with high force, such as pitching a baseball or having severe coughing spells. SYMPTOMS   Pain when you breathe in or cough.  Pain when someone presses on the injured area. DIAGNOSIS  Your caregiver will perform a physical exam. Various imaging tests may be ordered to confirm the diagnosis and to look for related injuries. These tests may include a chest X-ray, computed tomography (CT), magnetic resonance imaging (MRI), or a bone scan. TREATMENT  Rib fractures usually heal on their own in 1-3 months. The longer healing period is often associated with a continued cough or other aggravating activities. During the healing period, pain control is very important. Medication is usually given to control pain. Hospitalization or surgery may be needed for more severe injuries, such as those in which multiple ribs are broken or the ribs have moved out of place.  HOME CARE INSTRUCTIONS   Avoid strenuous activity and any activities or movements that cause pain. Be careful during activities and avoid bumping the injured rib.  Gradually increase activity as directed by your caregiver.  Only take over-the-counter or prescription medications as directed by your caregiver. Do not take other medications without asking your caregiver first.  Apply ice to the injured area for the first 1-2 days after you have been treated or as directed by your caregiver. Applying ice helps to reduce inflammation and pain.  Put ice in a plastic bag.  Place a towel between your skin and the bag.   Leave the ice on for 15-20 minutes at a time, every 2 hours while you are awake.  Perform deep breathing as directed by your caregiver. This will help prevent  pneumonia, which is a common complication of a broken rib. Your caregiver may instruct you to:  Take deep breaths several times a day.  Try to cough several times a day, holding a pillow against the injured area.  Use a device called an incentive spirometer to practice deep breathing several times a day.  Drink enough fluids to keep your urine clear or pale yellow. This will help you avoid constipation.   Do not wear a rib belt or binder. These restrict breathing, which can lead to pneumonia.  SEEK IMMEDIATE MEDICAL CARE IF:   You have a fever.   You have difficulty breathing or shortness of breath.   You develop a continual cough, or you cough up thick or bloody sputum.  You feel sick to your stomach (nausea), throw up (vomit), or have abdominal pain.   You have worsening pain not controlled with medications.  MAKE SURE  YOU:  Understand these instructions.  Will watch your condition.  Will get help right away if you are not doing well or get worse.   This information is not intended to replace advice given to you by your health care provider. Make sure you discuss any questions you have with your health care provider.   Document Released: 05/13/2005 Document Revised: 01/13/2013 Document Reviewed: 07/15/2012 Elsevier Interactive Patient Education [employer].

## 2015-04-12 NOTE — ED Notes (Signed)
Pt came in by EMS.  Daughter currently at bedside.  Pt has dementia and cannot answer questions appropriately.  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  Pt fell and hit L shoulder.  Upon arrival, noticed that L shoulder and L hip tender to touch and bruised.  Dr Dahlia Client notified of L hip bruising in addition to L shoulder injury. Pt resting comfortably at this time.

## 2015-04-12 NOTE — ED Provider Notes (Signed)
Patient received in checkout from Dr. Dahlia Client. She is in no acute distress, will be placed in a sling and referred to orthopedics for follow-up.  Earleen Newport, MD 04/12/15 [phone]

## 2015-04-12 NOTE — ED Notes (Addendum)
Pt presents from [facility] via EMS after she suffered a fall during a fire alarm with staff. Staff was attempting to evacuate pt and she fell against the wall with her left arm. Pt c/o left shoulder pain post fall and was sent for further evaluation. Bruising present to top of left shoulder; tenderness noted with palpation. Pt currently denies pain. Loc at baseline per staff and ems. Denies any other injury.

## 2015-04-12 NOTE — ED Provider Notes (Signed)
[facility] Emergency Department Provider Note  ____________________________________________  Time seen: Approximately [id number] AM  I have reviewed the triage vital signs and the nursing notes.   HISTORY  Chief Complaint Shoulder Pain  Patient with dementia so history Limited  HPI Brittany Casey is a 81 y.o. female who is currently living in a nursing home. The fire alarm went off this morning and as she was getting out of bed she fell against a wall and hit her left shoulder. The patient has a bruise to her left shoulder as well as some left shoulder pain. The patient was sent in for evaluation. The patient ports that she does not know what happened and reports he does not want to be a burden on anyone. She reports that she just wants to be left alone to die. The patient's daughter reports that she is at her baseline.   Past Medical History  Diagnosis Date  . Arthritis   . Stroke ([place])   . Coronary artery disease   . Breast cancer ([place]) 11/04/2013    left  . Atrial fibrillation ([place])   . Subdural hematoma ([place])   . Dementia   . Pulmonary hypertension ([place])   . COPD (chronic obstructive pulmonary disease) ([facility])     There are no active problems to display for this patient.   Past Surgical History  Procedure Laterality Date  . Cardiac surgery      pace maker   . Brain surgery      subdural hematoma  . Toe amputation      Current Outpatient Rx  Name  Route  Sig  Dispense  Refill  . ambrisentan (LETAIRIS) 5 MG tablet   Oral   Take 5 mg by mouth every morning.         Marland Kitchen anastrozole (ARIMIDEX) 1 MG tablet   Oral   Take 1 mg by mouth daily.         Marland Kitchen aspirin EC 81 MG tablet   Oral   Take 162 mg by mouth every morning.         . bacitracin ointment   Topical   Apply 1 application topically daily. Apply to cracked skin on fingers and apply with bandaids.         . Calcium Carb-Cholecalciferol (CALCIUM 600+D3) 600-800 MG-UNIT TABS   Oral    Take 1 tablet by mouth daily.         . celecoxib (CELEBREX) 200 MG capsule   Oral   Take 200 mg by mouth daily.         Marland Kitchen diltiazem (CARTIA XT) 180 MG 24 hr capsule   Oral   Take 180 mg by mouth daily.         Marland Kitchen donepezil (ARICEPT) 5 MG tablet   Oral   Take 10 mg by mouth at bedtime.          Marland Kitchen estradiol (ESTRACE) 0.1 MG/GM vaginal cream   Vaginal   Place 1 Applicatorful vaginally every Monday, Wednesday, and Friday.         . furosemide (LASIX) 80 MG tablet   Oral   Take 80 mg by mouth every other day.         . hydrochlorothiazide (HYDRODIURIL) 12.5 MG tablet   Oral   Take 12.5 mg by mouth daily.         Marland Kitchen loperamide (IMODIUM) 2 MG capsule   Oral   Take 2 mg by mouth 3 (three)  times daily. Tues,Weds,Sundays         . metoprolol (LOPRESSOR) 100 MG tablet   Oral   Take 100 mg by mouth 2 (two) times daily.         . mirtazapine (REMERON) 7.5 MG tablet   Oral   Take 7.5 mg by mouth at bedtime.         . Multiple Vitamins-Minerals (MULTIVITAMIN PO)   Oral   Take 1 tablet by mouth every morning.         . potassium chloride SA (K-DUR,KLOR-CON) 20 MEQ tablet   Oral   Take 20 mEq by mouth daily.         . psyllium (REGULOID) 0.52 G capsule   Oral   Take 0.52 g by mouth daily.         . sertraline (ZOLOFT) 50 MG tablet   Oral   Take 50 mg by mouth every evening.           Allergies Tape  No family history on file.  Social History Social History  Substance Use Topics  . Smoking status: Never Smoker   . Smokeless tobacco: Former Systems developer    Types: Snuff  . Alcohol Use: No    Review of Systems  Unable to assess due to patient dementia  ____________________________________________   PHYSICAL EXAM:  VITAL SIGNS: ED Triage Vitals  Enc Vitals Group     BP 04/12/15 0433 137/63 mmHg     Pulse Rate 04/12/15 0433 72     Resp 04/12/15 0630 15     Temp 04/12/15 0433 98 F (36.7 C)     Temp Source 04/12/15 0433 Oral     SpO2  04/12/15 0630 92 %     Weight 04/12/15 0433 123 lb (55.792 kg)     Height 04/12/15 0433 5\' 5"  (1.651 m)     Head Cir --      Peak Flow --      Pain Score --      Pain Loc --      Pain Edu? --      Excl. in [place]? --     Constitutional: Alert and disoriented to place and time. Well appearing and in moderate  distress. Eyes: Conjunctivae are normal. PERRL. EOMI. Head: Atraumatic. Nose: No congestion/rhinnorhea. Mouth/Throat: Mucous membranes are moist.  Oropharynx non-erythematous. Neck: No cervical spine tenderness to palpation. Cardiovascular: Normal rate, irregular rhythm. Grossly normal heart sounds.  Good peripheral circulation. Respiratory: Normal respiratory effort.  No retractions. Lungs CTAB. Gastrointestinal: Soft and nontender. No distention. Positive bowel sounds Musculoskeletal: pain to top of left shoulder with bruising Neurologic:  Normal speech and language. No gross focal neurologic deficits are appreciated.  Skin:  Ecchymosis to left shoulder and left hip Psychiatric: patient at baseline regarding mental status, wants to be left alone to die  ____________________________________________   LABS (all labs ordered are listed, but only abnormal results are displayed)  Labs Reviewed  CBC - Abnormal; Notable for the following:    RDW 17.8 (*)    All other components within normal limits  COMPREHENSIVE METABOLIC PANEL - Abnormal; Notable for the following:    Glucose, Bld 119 (*)    BUN 33 (*)    Creatinine, Ser 1.47 (*)    AST 65 (*)    GFR calc non Af Amer 31 (*)    GFR calc Af Amer 36 (*)    All other components within normal limits  TROPONIN I - Abnormal; Notable  for the following:    Troponin I 0.04 (*)    All other components within normal limits  URINALYSIS COMPLETEWITH MICROSCOPIC ([facility] ONLY)   ____________________________________________  EKG  ED ECG REPORT I, Loney Hering, the attending physician, personally viewed and interpreted this  ECG.   Date: 04/12/2015  EKG Time: 510  Rate: 71  Rhythm: atrial fibrillation, rate 71  Axis: normal  Intervals:none  ST&T Change: flipped to waves leads II, III, avf, v5,v6  ____________________________________________  RADIOLOGY  Left hip xray: No acute abnormality noted Left shoulder xray: Acute mildly displaced fracture of the left lateral rib, likely second rib, minimally displaced distal clavicle fracture CXR: Mildly displaced left upper rib fracture, no pneumothorax. Moderate pleural effusions with associated bibasilar opacities, increased on the right ____________________________________________   PROCEDURES  Procedure(s) performed: None  Critical Care performed: No  ____________________________________________   INITIAL IMPRESSION / ASSESSMENT AND PLAN / ED COURSE  Pertinent labs & imaging results that were available during my care of the patient were reviewed by me and considered in my medical decision making (see chart for details).  This is an [age] female with a history of dementia who comes in today after falling into a wall at her nursing home. The patient did have some hypoxia when she initially arrived we placed her on O2. The patient's lungs sound unremarkable. The patient's blood work shows some improvement in her creatinine from previous and a troponin that is always elevated at 0.04. I will check a urinalysis to determine if the patient had another cause for her fall today. I gave the patient a dose of Percocet in the patient's care will be signed out to Dr. Lenise Arena who will follow-up the results of the urinalysis and disposition the patient. ____________________________________________   FINAL CLINICAL IMPRESSION(S) / ED DIAGNOSES  Final diagnoses:  Closed fracture of distal clavicle, left, initial encounter  Left rib fracture, closed, initial encounter  Fall, initial encounter      Loney Hering, MD 04/12/15 [phone]

## 2015-04-12 NOTE — ED Notes (Signed)
Patient transported to X-ray 

## 2015-04-26 ENCOUNTER — Ambulatory Visit
Admission: RE | Admit: 2015-04-26 | Discharge: 2015-04-26 | Disposition: A | Discharge status: Home / Self Care | Payer: Medicare (Managed Care) | Source: Ambulatory Visit | Attending: Student | Admitting: Student

## 2015-04-26 ENCOUNTER — Other Ambulatory Visit: Payer: Self-pay | Admitting: Student

## 2015-04-26 DIAGNOSIS — Z9181 History of falling: Secondary | ICD-10-CM | POA: Diagnosis not present

## 2015-04-26 DIAGNOSIS — M25552 Pain in left hip: Secondary | ICD-10-CM

## 2015-04-26 DIAGNOSIS — S32501A Unspecified fracture of right pubis, initial encounter for closed fracture: Secondary | ICD-10-CM | POA: Insufficient documentation

## 2015-04-26 DIAGNOSIS — R262 Difficulty in walking, not elsewhere classified: Secondary | ICD-10-CM | POA: Insufficient documentation

## 2015-05-28 DEATH — deceased

## 2015-06-19 ENCOUNTER — Other Ambulatory Visit: Payer: Medicare (Managed Care)

## 2015-06-19 ENCOUNTER — Ambulatory Visit: Payer: Medicare (Managed Care) | Admitting: Internal Medicine

## 2015-08-30 ENCOUNTER — Ambulatory Visit: Payer: Self-pay | Admitting: Urology

## 2015-09-14 IMAGING — CR DG CHEST 1V PORT
1 series · 1 of 1 positions shown · non-contrast
Comparison: 02/07/2014

CLINICAL DATA: Acute onset chest pain today. Chronic atrial
fibrillation. COPD.

EXAM:
PORTABLE CHEST - 1 VIEW

[ap]
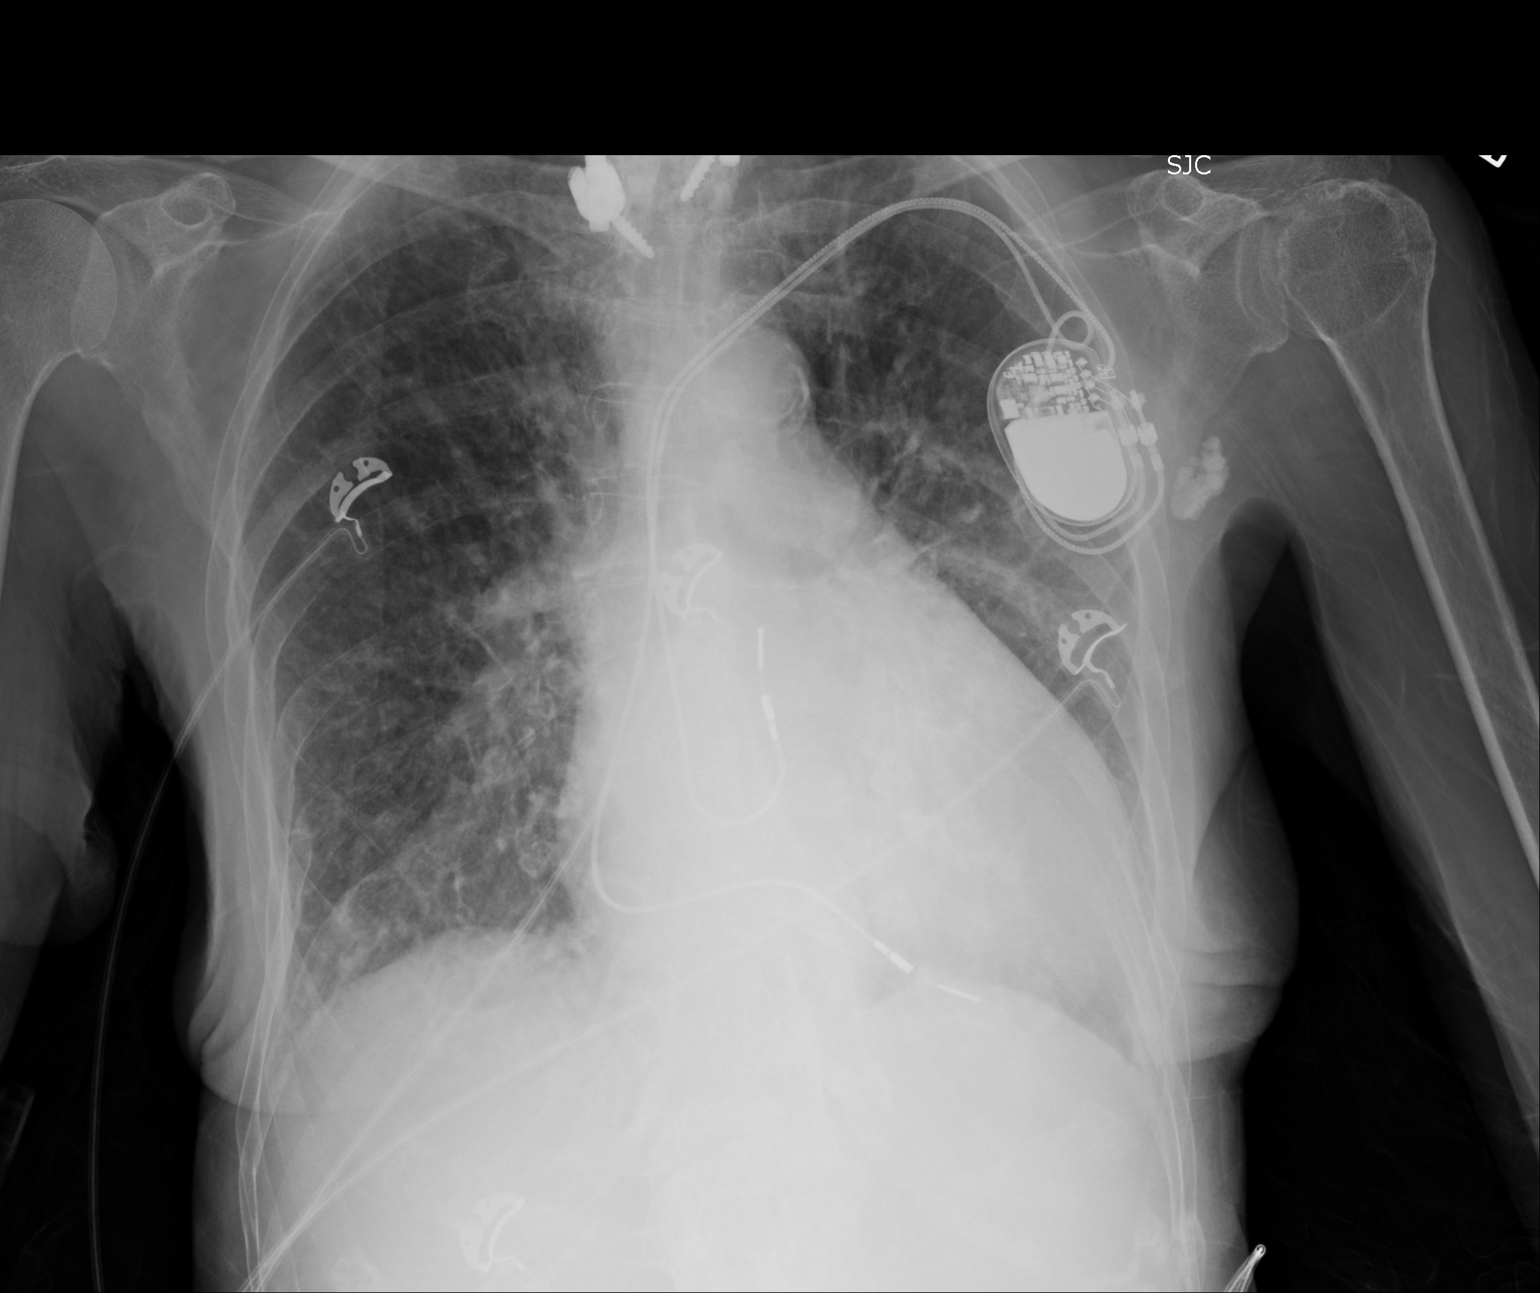

[1 of 1 positions shown; findings below may reference images not displayed]

FINDINGS: Mild to moderate cardiomegaly is stable. Pacemaker remains in
appropriate position.

Chronic pulmonary vascular congestion is noted, without definite
evidence of acute infiltrate or edema. Pulmonary hyperinflation
again seen, consistent with COPD. No evidence of pleural effusion.
Cervical and upper thoracic spine fusion hardware again noted.
IMPRESSION: Stable cardiomegaly, pulmonary vascular congestion, and COPD. No
acute findings.
